# Patient Record
Sex: Female | Born: 1995 | Race: Black or African American | Hispanic: No | Marital: Single | Smoking: Never smoker
Health system: Southern US, Community
[De-identification: ages and names within clinical notes are randomized; demographics above are authoritative.]

## PROBLEM LIST (undated history)

## (undated) DIAGNOSIS — T7840XA Allergy, unspecified, initial encounter: Secondary | ICD-10-CM

## (undated) DIAGNOSIS — J45909 Unspecified asthma, uncomplicated: Secondary | ICD-10-CM

## (undated) DIAGNOSIS — K219 Gastro-esophageal reflux disease without esophagitis: Secondary | ICD-10-CM

## (undated) HISTORY — PX: CHOLECYSTECTOMY: SHX55

## (undated) HISTORY — DX: Allergy, unspecified, initial encounter: T78.40XA

---
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
## 2017-04-28 ENCOUNTER — Emergency Department (HOSPITAL_COMMUNITY): Payer: Medicaid Other

## 2017-04-28 ENCOUNTER — Encounter (HOSPITAL_COMMUNITY): Payer: Self-pay | Admitting: Emergency Medicine

## 2017-04-28 ENCOUNTER — Emergency Department (HOSPITAL_COMMUNITY)
Admission: EM | Admit: 2017-04-28 | Discharge: 2017-04-28 | Disposition: A | Discharge status: Home / Self Care | Payer: Medicaid Other | Attending: Emergency Medicine | Admitting: Emergency Medicine

## 2017-04-28 DIAGNOSIS — Z79899 Other long term (current) drug therapy: Secondary | ICD-10-CM | POA: Diagnosis not present

## 2017-04-28 DIAGNOSIS — R51 Headache: Secondary | ICD-10-CM | POA: Diagnosis not present

## 2017-04-28 DIAGNOSIS — J45909 Unspecified asthma, uncomplicated: Secondary | ICD-10-CM | POA: Insufficient documentation

## 2017-04-28 DIAGNOSIS — I158 Other secondary hypertension: Secondary | ICD-10-CM | POA: Diagnosis not present

## 2017-04-28 DIAGNOSIS — R519 Headache, unspecified: Secondary | ICD-10-CM

## 2017-04-28 LAB — I-STAT CHEM 8, ED
BUN: 5 mg/dL — ABNORMAL LOW (ref 6–20)
Calcium, Ion: 1.26 mmol/L (ref 1.15–1.40)
Chloride: 101 mmol/L (ref 101–111)
Creatinine, Ser: 0.7 mg/dL (ref 0.44–1.00)
Glucose, Bld: 107 mg/dL — ABNORMAL HIGH (ref 65–99)
HCT: 41 % (ref 36.0–46.0)
Hemoglobin: 13.9 g/dL (ref 12.0–15.0)
Potassium: 3.8 mmol/L (ref 3.5–5.1)
Sodium: 141 mmol/L (ref 135–145)
TCO2: 29 mmol/L (ref 0–100)

## 2017-04-28 LAB — I-STAT BETA HCG BLOOD, ED (MC, WL, AP ONLY)

## 2017-04-28 MED ORDER — KETOROLAC TROMETHAMINE 30 MG/ML IJ SOLN
30.0000 mg | Freq: Once | INTRAMUSCULAR | Status: AC
  Administered 2017-04-28: 30 mg via INTRAVENOUS
  Filled 2017-04-28: qty 1

## 2017-04-28 MED ORDER — DIPHENHYDRAMINE HCL 50 MG/ML IJ SOLN
25.0000 mg | Freq: Once | INTRAMUSCULAR | Status: AC
  Administered 2017-04-28: 25 mg via INTRAVENOUS
  Filled 2017-04-28: qty 1

## 2017-04-28 MED ORDER — SODIUM CHLORIDE 0.9 % IV BOLUS (SEPSIS)
1000.0000 mL | Freq: Once | INTRAVENOUS | Status: AC
  Administered 2017-04-28: 1000 mL via INTRAVENOUS

## 2017-04-28 MED ORDER — PROCHLORPERAZINE EDISYLATE 5 MG/ML IJ SOLN
10.0000 mg | Freq: Once | INTRAMUSCULAR | Status: AC
  Administered 2017-04-28: 10 mg via INTRAVENOUS
  Filled 2017-04-28: qty 2

## 2017-04-28 MED ORDER — DEXAMETHASONE SODIUM PHOSPHATE 10 MG/ML IJ SOLN
10.0000 mg | Freq: Once | INTRAMUSCULAR | Status: AC
  Administered 2017-04-28: 10 mg via INTRAVENOUS
  Filled 2017-04-28: qty 1

## 2017-04-28 MED ORDER — BUTALBITAL-APAP-CAFFEINE 50-325-40 MG PO TABS: 1.0000 | ORAL_TABLET | Freq: Four times a day (QID) | ORAL | 0 refills | Status: AC | PRN

## 2017-04-28 NOTE — ED Triage Notes (Signed)
Pt reports a generalized HA for the past week that is not going away. Tried ibuprofen and tylenol without relief. No emesis. No difference in pain with lights or loud sounds.

## 2017-04-28 NOTE — Discharge Instructions (Signed)
Return here as needed. Follow up with a primary doctor.  Your head CT was normal. Definitely need to follow-up with the primary doctor about your blood pressure

## 2017-04-29 NOTE — ED Provider Notes (Signed)
[facility] Provider Note   CSN: [id number] Arrival date & time: 04/28/17  1802     History   Chief Complaint Chief Complaint  Patient presents with  . Headache    HPI Pamela Valencia is a 21 y.o. female.  HPI Patient presents to the emergency department with generalized headache over the last 5 days.  The patient states that the headache started Tuesday and she went to an urgent care and was given Toradol with minimal relief of her symptoms.  She states her in this timeframe she has noticed that her blood pressure has been elevated, states that she has not noted her blood pressure to be elevated in the past. The patient denies chest pain, shortness of breath,blurred vision, neck pain, fever, cough, weakness, numbness,anorexia, edema, abdominal pain, nausea, vomiting, diarrhea, rash, back pain, dysuria, hematemesis, bloody stool, near syncope, or syncope. Past Medical History:  Diagnosis Date  . Acid reflux   . Asthma     There are no active problems to display for this patient.   History reviewed. No pertinent surgical history.  OB History    No data available       Home Medications    Prior to Admission medications   Medication Sig Start Date End Date Taking? Authorizing Provider  azithromycin (ZITHROMAX) 250 MG tablet Take 1 tablet (250 mg total) by mouth daily. Take first 2 tablets together, then 1 every day until finished. Patient not taking: Reported on 07/23/2016 07/15/16   Riki SheerYoung, Michelle G, PA-C  butalbital-acetaminophen-caffeine [place](FIORICET, ESGIC) [phone] MG tablet Take 1-2 tablets by mouth every 6 (six) hours as needed for headache. 04/28/17 04/28/18  Neyah Ellerman, Cristal Deerhristopher, PA-C  famotidine (PEPCID) 20 MG tablet Take 1 tablet (20 mg total) by mouth 2 (two) times daily. 07/23/16   Kirichenko, Lemont Fillersatyana, PA-C  hydrOXYzine (ATARAX/VISTARIL) 10 MG tablet Take 10 mg by mouth daily.    [provider]  ibuprofen (ADVIL,MOTRIN) 800 MG tablet Take 800  mg by mouth every 8 (eight) hours as needed for pain.    [provider]  Levonorgestrel-Ethinyl Estradiol (AMETHIA,CAMRESE) 0.15-0.03 &0.01 MG tablet Take 1 tablet by mouth daily. 09/02/15   Allie Bossierove, Myra C, MD  montelukast (SINGULAIR) 10 MG tablet Take 10 mg by mouth at bedtime.    [provider]  nystatin (MYCOSTATIN) 100000 UNIT/ML suspension Take 5 mLs (500,000 Units total) by mouth 4 (four) times daily. Patient not taking: Reported on 07/23/2016 07/15/16   Riki SheerYoung, Michelle G, PA-C  omeprazole (PRILOSEC) 20 MG capsule  07/10/16   [provider]  ondansetron (ZOFRAN ODT) 8 MG disintegrating tablet Take 1 tablet (8 mg total) by mouth every 8 (eight) hours as needed for nausea or vomiting. 07/23/16   Kirichenko, Ceriah, PA-C  sucralfate (CARAFATE) 1 g tablet Take 1 tablet (1 g total) by mouth 4 (four) times daily -  with meals and at bedtime. 07/23/16   Jaynie CrumbleKirichenko, Brunella, PA-C    Family History History reviewed. No pertinent family history.  Social History Social History  Substance Use Topics  . Smoking status: Never Smoker  . Smokeless tobacco: Never Used  . Alcohol use No     Allergies   Penicillins   Review of Systems Review of Systems  All other systems negative except as documented in the HPI. All pertinent positives and negatives as reviewed in the HPI. Physical Exam Updated Vital Signs BP (!) 138/93 (BP Location: Left Arm)   Pulse 80   Temp 98.1 F (36.7 C) (Oral)  Resp 16   LMP 04/22/2017   SpO2 100%   Physical Exam  Constitutional: She is oriented to person, place, and time. She appears well-developed and well-nourished. No distress.  HENT:  Head: Normocephalic and atraumatic.  Mouth/Throat: Oropharynx is clear and moist.  Eyes: Pupils are equal, round, and reactive to light.  Neck: Normal range of motion. Neck supple.  Cardiovascular: Normal rate, regular rhythm and normal heart sounds.  Exam reveals no gallop and no friction rub.    No murmur heard. Pulmonary/Chest: Effort normal and breath sounds normal. No respiratory distress. She has no wheezes.  Neurological: She is alert and oriented to person, place, and time. She displays normal reflexes. No cranial nerve deficit or sensory deficit. She exhibits normal muscle tone. Coordination normal.  Skin: Skin is warm and dry. Capillary refill takes less than 2 seconds. No rash noted. No erythema.  Psychiatric: She has a normal mood and affect. Her behavior is normal.  Nursing note and vitals reviewed.    ED Treatments / Results  Labs (all labs ordered are listed, but only abnormal results are displayed) Labs Reviewed  I-STAT CHEM 8, ED - Abnormal; Notable for the following:       Result Value   BUN 5 (*)    Glucose, Bld 107 (*)    All other components within normal limits  I-STAT BETA HCG BLOOD, ED ([facility], [facility], [facility] ONLY)    EKG  EKG Interpretation None       Radiology Ct Head Wo Contrast  Result Date: 04/28/2017 CLINICAL DATA:  Generalized headache for 1 week. No relief with home administration of over the counter meds. EXAM: CT HEAD WITHOUT CONTRAST TECHNIQUE: Contiguous axial images were obtained from the base of the skull through the vertex without intravenous contrast. COMPARISON:  None. FINDINGS: Brain: There is no intracranial hemorrhage, mass or evidence of acute infarction. There is no extra-axial fluid collection. Gray matter and white matter appear normal. Cerebral volume is normal for age. Brainstem and posterior fossa are unremarkable. The CSF spaces appear normal. Vascular: No hyperdense vessel or unexpected calcification. Skull: Normal. Negative for fracture or focal lesion. Sinuses/Orbits: No acute finding. Other: None. IMPRESSION: Normal brain Electronically Signed   By: Ellery Plunkaniel R Mitchell M.D.   On: 04/28/2017 21:32    Procedures Procedures (including critical care time)  Medications Ordered in ED Medications  sodium chloride 0.9 % bolus 1,000 mL  (0 mLs Intravenous Stopped 04/28/17 2234)  prochlorperazine (COMPAZINE) injection 10 mg (10 mg Intravenous Given 04/28/17 2052)  dexamethasone (DECADRON) injection 10 mg (10 mg Intravenous Given 04/28/17 2053)  diphenhydrAMINE (BENADRYL) injection 25 mg (25 mg Intravenous Given 04/28/17 2053)  ketorolac (TORADOL) 30 MG/ML injection 30 mg (30 mg Intravenous Given 04/28/17 2053)     Initial Impression / Assessment and Plan / ED Course  I have reviewed the triage vital signs and the nursing notes.  Pertinent labs & imaging results that were available during my care of the patient were reviewed by me and considered in my medical decision making (see chart for details).  Clinical Course as of Apr 30 151  Fri Apr 28, 2017  2021 Comment 3:        [AK]  2021 Comment 3:        [AK]  2021 I-stat hCG, quantitative: <5.0 [AK]  2022 I-stat hCG, quantitative: <5.0 [AK]  2022 Comment 3:        [AK]  2201 CT Head Wo Contrast [AK]    Clinical Course  User Index [AK] Jana Half, Student-PA    Patient does have elevated blood pressure, but I did advise her that we would need to get her headache under control first and then evaluate the blood pressure.  There is laceration to follow-up with the primary care Dr. for evaluation of her blood pressure as this could be causing symptoms for her.  Patient agrees the plan and all questions were any signs of hypertensive urgency or crisis at this point  Final Clinical Impressions(s) / ED Diagnoses   Final diagnoses:  Bad headache  Other secondary hypertension    New Prescriptions Discharge Medication List as of 04/28/2017 10:24 PM    START taking these medications   Details  butalbital-acetaminophen-caffeine (FIORICET, ESGIC) 50-325-40 MG tablet Take 1-2 tablets by mouth every 6 (six) hours as needed for headache., Starting Fri 04/28/2017, Until Sat 04/28/2018, Print         Daden Mahany, [place], PA-C 04/29/17 0154    Tilden Fossa, MD 04/30/17  [phone]

## 2017-09-14 ENCOUNTER — Ambulatory Visit: Payer: Self-pay | Admitting: Family Medicine

## 2017-09-18 ENCOUNTER — Ambulatory Visit: Payer: Self-pay | Admitting: Family Medicine

## 2018-03-07 ENCOUNTER — Encounter: Payer: Self-pay | Admitting: *Deleted

## 2018-03-22 ENCOUNTER — Encounter: Payer: Medicaid Other | Admitting: Obstetrics & Gynecology

## 2018-04-17 ENCOUNTER — Emergency Department (HOSPITAL_COMMUNITY)
Admission: EM | Admit: 2018-04-17 | Discharge: 2018-04-18 | Discharge status: Against Medical Advice | Payer: Medicaid Other | Attending: Emergency Medicine | Admitting: Emergency Medicine

## 2018-04-17 ENCOUNTER — Encounter (HOSPITAL_COMMUNITY): Payer: Self-pay | Admitting: Emergency Medicine

## 2018-04-17 DIAGNOSIS — R1084 Generalized abdominal pain: Secondary | ICD-10-CM | POA: Diagnosis not present

## 2018-04-17 DIAGNOSIS — R300 Dysuria: Secondary | ICD-10-CM | POA: Insufficient documentation

## 2018-04-17 DIAGNOSIS — R110 Nausea: Secondary | ICD-10-CM | POA: Diagnosis not present

## 2018-04-17 LAB — URINALYSIS, ROUTINE W REFLEX MICROSCOPIC
Bilirubin Urine: NEGATIVE
GLUCOSE, UA: NEGATIVE mg/dL
Hgb urine dipstick: NEGATIVE
KETONES UR: NEGATIVE mg/dL
LEUKOCYTES UA: NEGATIVE
Nitrite: NEGATIVE
PROTEIN: NEGATIVE mg/dL
Specific Gravity, Urine: 1.005 (ref 1.005–1.030)
pH: 5 (ref 5.0–8.0)

## 2018-04-17 LAB — CBC
HCT: 38.5 % (ref 36.0–46.0)
Hemoglobin: 12.4 g/dL (ref 12.0–15.0)
MCH: 29.7 pg (ref 26.0–34.0)
MCHC: 32.2 g/dL (ref 30.0–36.0)
MCV: 92.3 fL (ref 78.0–100.0)
PLATELETS: 313 10*3/uL (ref 150–400)
RBC: 4.17 MIL/uL (ref 3.87–5.11)
RDW: 12.1 % (ref 11.5–15.5)
WBC: 14.9 10*3/uL — AB (ref 4.0–10.5)

## 2018-04-17 LAB — I-STAT TROPONIN, ED: TROPONIN I, POC: 0 ng/mL (ref 0.00–0.08)

## 2018-04-17 NOTE — ED Notes (Signed)
Pt informed RN that she was leaving, will follow up w/ PCP tomorrow.  RN attempted several times to convince pt to stay.

## 2018-04-17 NOTE — ED Triage Notes (Signed)
Patient reports upper/mid abdominal pain with nausea onset this evening , denies emesis or diarrhea , pt. added mild dysuria , no fever or chills .

## 2018-04-18 ENCOUNTER — Ambulatory Visit: Payer: Medicaid Other | Admitting: Physical Therapy

## 2018-04-18 LAB — COMPREHENSIVE METABOLIC PANEL
ALT: 19 U/L (ref 0–44)
ANION GAP: 10 (ref 5–15)
AST: 17 U/L (ref 15–41)
Albumin: 4.1 g/dL (ref 3.5–5.0)
Alkaline Phosphatase: 86 U/L (ref 38–126)
BUN: 10 mg/dL (ref 6–20)
CALCIUM: 9.9 mg/dL (ref 8.9–10.3)
CO2: 23 mmol/L (ref 22–32)
CREATININE: 1.02 mg/dL — AB (ref 0.44–1.00)
Chloride: 106 mmol/L (ref 98–111)
GFR calc Af Amer: >60 mL/min (ref >60)
GFR calc non Af Amer: >60 mL/min (ref >60)
Glucose, Bld: 120 mg/dL — ABNORMAL HIGH (ref 70–99)
Potassium: 4 mmol/L (ref 3.5–5.1)
SODIUM: 139 mmol/L (ref 135–145)
Total Bilirubin: 0.4 mg/dL (ref 0.3–1.2)
Total Protein: 8 g/dL (ref 6.5–8.1)

## 2018-04-18 LAB — LIPASE, BLOOD: LIPASE: 31 U/L (ref 11–51)

## 2018-04-24 ENCOUNTER — Other Ambulatory Visit: Payer: Self-pay

## 2018-04-24 ENCOUNTER — Ambulatory Visit: Payer: Medicaid Other | Attending: Physician Assistant | Admitting: Physical Therapy

## 2018-04-24 ENCOUNTER — Encounter: Payer: Self-pay | Admitting: Physical Therapy

## 2018-04-24 DIAGNOSIS — M5441 Lumbago with sciatica, right side: Secondary | ICD-10-CM | POA: Insufficient documentation

## 2018-04-24 DIAGNOSIS — M62830 Muscle spasm of back: Secondary | ICD-10-CM | POA: Diagnosis present

## 2018-04-24 DIAGNOSIS — G8929 Other chronic pain: Secondary | ICD-10-CM | POA: Insufficient documentation

## 2018-04-24 NOTE — Therapy (Signed)
[facility] [address] [place], [place], [address] Phone: [phone]   Fax:  [phone]  Physical Therapy Evaluation  Patient Details  Name: Pamela Valencia MRN: [id number] Date of Birth: 22-Aug-1996 Referring Provider: Neldon Labella PA    Encounter Date: 04/24/2018  PT End of Session - 04/24/18 1055    Visit Number  1    Number of Visits  12    Date for PT Re-Evaluation  06/05/18    Authorization Type  Mediciad     PT Start Time  0930    PT Stop Time  1012    PT Time Calculation (min)  42 min    Activity Tolerance  Patient tolerated treatment well    Behavior During Therapy  [facility] for tasks assessed/performed       Past Medical History:  Diagnosis Date  . Acid reflux   . Asthma     History reviewed. No pertinent surgical history.  There were no vitals filed for this visit.   Subjective Assessment - 04/24/18 0936    Subjective  Patient has been having lower back pain for 3-4 months as well as tigling into her right arm and her right leg. The pain is worse when she is up and moving. Thge tingling usualy starts in her hand then travels down into her arm.    Limitations  Standing;Walking;Lifting    How long can you stand comfortably?  variable     Diagnostic tests  MRI: per patient negative X-ray: potential scoliosis     Patient Stated Goals  Less pain in the back; get rid of tingling     Currently in Pain?  Yes    Pain Descriptors / Indicators  Aching;Sharp;Tingling    Pain Type  Acute pain    Pain Onset  More than a month ago    Pain Frequency  Constant         [facility] PT Assessment - 04/24/18 0001      Assessment   Medical Diagnosis  Low Back pain with right radiculopathy     Referring Provider  Misty Stanley Wingate PA     Onset Date/Surgical Date  -- 4 months ago     Hand Dominance  Right    Prior Therapy  None       Precautions   Precautions  None      Restrictions   Weight Bearing Restrictions  No      Balance Screen   Has the patient fallen in the past 6 months  No    Has the patient had a decrease in activity level because of a fear of falling?   No    Is the patient reluctant to leave their home because of a fear of falling?   No      Home Environment   Additional Comments  Steps into the house but does not cause tringling       Prior Function   Level of Independence  Independent    Vocation  Full time employment    Horticulturist, commercial at [employer]     Leisure  Nothing       Cognition   Overall Cognitive Status  Within Functional Limits for tasks assessed    Attention  Focused    Focused Attention  Appears intact    Memory  Appears intact    Awareness  Appears intact    Problem Solving  Appears intact      Observation/Other Assessments  Focus on Therapeutic Outcomes (FOTO)   Medicaid       Sensation   Additional Comments  tingling into right lower and upper extremity       Coordination   Gross Motor Movements are Fluid and Coordinated  Yes    Fine Motor Movements are Fluid and Coordinated  Yes      ROM / Strength   AROM / PROM / Strength  AROM;PROM;Strength      AROM   AROM Assessment Site  Lumbar    Lumbar Flexion  limited 25% with ain at end range     Lumbar Extension  pain at end range but no ;limit     Lumbar - Right Side Bend  Pain to the right    Lumbar - Left Side Bend  stretch to the left     Lumbar - Right Rotation  pain     Lumbar - Left Rotation  normal       PROM   PROM Assessment Site  Hip    Right/Left Hip  Right;Left    Right Hip Internal Rotation   -- Pain at end range       Strength   Strength Assessment Site  Hip;Knee;Ankle    Right/Left Hip  Right;Left    Right Hip Flexion  4/5    Right Hip Extension  4/5    Right Hip ABduction  4/5    Left Hip Flexion  4+/5    Left Hip ABduction  5/5    Left Hip ADduction  5/5      Right Hip   Right Hip Flexion  -- pain at end range       Palpation   Spinal mobility  decreased PA  glide at L3 and L4;     SI assessment   ASIS equal       Special Tests   Other special tests  SI compression (-)       Ambulation/Gait   Gait Comments  decreased hip flexion on the right. Scuffed her foot when walking                 Objective measurements completed on examination: See above findings.      [facility] Adult PT Treatment/Exercise - 04/24/18 0001      Lumbar Exercises: Stretches   Other Lumbar Stretch Exercise  prayer stretch 2x20 sec and lateral 1x320 sec also reviewed how to do it at the sink;       Lumbar Exercises: Standing   Other Standing Lumbar Exercises  tennis ball trigger point release       Lumbar Exercises: Supine   AB Set Limitations  x5 with cuing     Pelvic Tilt Limitations  x5 with cuing with ab breathing              PT Education - 04/24/18 2021    Education Details  symptom mangement     Person(s) Educated  Patient    Methods  Explanation;Demonstration;Tactile cues;Verbal cues    Comprehension  Verbalized understanding;Returned demonstration;Verbal cues required;Tactile cues required;Need further instruction       PT Short Term Goals - 04/24/18 1048      PT SHORT TERM GOAL #1   Title  Patient will increase right lower extremity strength to 5/5     Baseline  4/5 right hip flexion 4+/5 right hip abduction     Time  4    Period  Weeks    Status  New  Target Date  05/22/18      PT SHORT TERM GOAL #2   Title  Patient will demostrate full lumbar flexion without pain     Baseline  limited 25% but significant pain     Time  4    Period  Weeks    Status  New    Target Date  05/22/18      PT SHORT TERM GOAL #3   Title  Patient will be independent with initial HEP     Baseline  No HEP     Time  4    Period  Weeks    Status  New    Target Date  05/22/18        PT Long Term Goals - 04/24/18 1051      PT LONG TERM GOAL #1   Title  Patient will bend over to pivk object off the ground without pain     Time  6    Period   Weeks    Status  New    Target Date  06/05/18      PT LONG TERM GOAL #2   Title  Patient will report no numbness or lower backp pain when standing >30 min in order to work    Baseline  at times can only stand 5-10 minutes beofree numbness and tingling increase.     Time  6    Period  Weeks    Status  New    Target Date  06/05/18             Plan - 04/24/18 2022    Clinical Impression Statement  Patient is a 22 year old female with right sided lower back pain that radiates into her right leg. She also has numbness and tingling into her right leg. She also reports numbness and tingling in the right upper extremity. She has weakness in her right hand compared to her left. She also has right hip weakness. Per patient report her x-ray shows scoliosis. Signs and symptoms are consitent with lumbar facet joint dysfucntion. She has significant spasming in her right lumbar paraspinales into her right gluteal. She would benefit from skilled therapy for a stretching and core strengthening program as well as manual therapy and dry needling to reduce spasming of the lumbar spine.    History and Personal Factors relevant to plan of care:  None     Clinical Presentation  Evolving    Clinical Presentation due to:  increasing spasming and numbness into her right lower extremity     Clinical Decision Making  Low    Rehab Potential  Good    PT Frequency  2x / week    PT Duration  6 weeks    PT Treatment/Interventions  ADLs/Self Care Home Management;Cryotherapy;Electrical Stimulation;Iontophoresis 4mg /ml Dexamethasone;Moist Heat;Ultrasound;Gait training;Stair training;Therapeutic activities;Therapeutic exercise;Patient/family education;Neuromuscular re-education;Manual techniques;Passive range of motion;Dry needling;Taping    PT Next Visit Plan  TPDN when able; begin core strengthening; bridges; clam shell; consdier quadruped if no tingling; progress to palnks; consdier UE core stability exercises; manual  therapy to right lumbar spine LAD; review home exercises     PT Home Exercise Plan  prayer stretch; lateral prayer stretch; piriformis stretch, tennis ball trigger point release; posterior pelvic tilt with abdominal breathing     Consulted and Agree with Plan of Care  Patient       Patient will benefit from skilled therapeutic intervention in order to improve the following deficits and impairments:  Pain, Postural  dysfunction, Decreased activity tolerance, Decreased endurance, Decreased range of motion, Decreased strength, Increased muscle spasms  Visit Diagnosis: Chronic right-sided low back pain with right-sided sciatica - Plan: PT plan of care cert/re-cert  Muscle spasm of back - Plan: PT plan of care cert/re-cert     Problem List There are no active problems to display for this patient.   Dessie Coma PT DPT  04/24/2018, 8:42 PM  [facility] [address] [place], [place], [address] Phone: [phone]   Fax:  [phone]  Name: SAYWARD HORVATH MRN: [id number] Date of Birth: 13-May-1996

## 2018-04-24 NOTE — Patient Instructions (Signed)
Unable to put in 2nd to update

## 2018-04-27 NOTE — Addendum Note (Signed)
Addended by: Dessie ComaARROLL, Nicol Herbig J on: 04/27/2018 11:04 AM   Modules accepted: Orders

## 2018-05-01 ENCOUNTER — Ambulatory Visit: Payer: Medicaid Other | Admitting: Physical Therapy

## 2018-05-07 ENCOUNTER — Ambulatory Visit: Payer: Medicaid Other | Admitting: Physical Therapy

## 2018-05-16 ENCOUNTER — Ambulatory Visit: Payer: Medicaid Other | Attending: Physician Assistant | Admitting: Physical Therapy

## 2018-05-16 ENCOUNTER — Encounter: Payer: Self-pay | Admitting: Physical Therapy

## 2018-05-16 DIAGNOSIS — G8929 Other chronic pain: Secondary | ICD-10-CM | POA: Diagnosis present

## 2018-05-16 DIAGNOSIS — M5441 Lumbago with sciatica, right side: Secondary | ICD-10-CM | POA: Diagnosis not present

## 2018-05-16 DIAGNOSIS — M62830 Muscle spasm of back: Secondary | ICD-10-CM | POA: Insufficient documentation

## 2018-05-16 NOTE — Therapy (Signed)
[place], [place], [address] Phone: [phone]   Fax:  [phone]  Physical Therapy Treatment/ Discharge   Patient Details  Name: Pamela Valencia MRN: [id number] Date of Birth: 1996/08/30 Referring Provider: Precious Gilding PA    Encounter Date: 05/16/2018  PT End of Session - 05/16/18 1033    Visit Number  2    Number of Visits  12    Date for PT Re-Evaluation  06/05/18    Authorization Type  [employer]     PT Start Time  1015    PT Stop Time  1056    PT Time Calculation (min)  41 min    Activity Tolerance  Patient tolerated treatment well    Behavior During Therapy  [facility] for tasks assessed/performed       Past Medical History:  Diagnosis Date  . Acid reflux   . Asthma     History reviewed. No pertinent surgical history.  There were no vitals filed for this visit.  Subjective Assessment - 05/16/18 1033    Subjective  Patient feels like some of the stretches help but overall it is the same. The patient hasnt been able to come to therapy for 3 weeks now because of work. She loives in [place] and only comes to [place] for therapy. She had an instance last week where her leg went numb.     Limitations  Standing;Walking;Lifting    How long can you stand comfortably?  variable     Diagnostic tests  MRI: per patient negative X-ray: potential scoliosis     Patient Stated Goals  Less pain in the back; get rid of tingling     Currently in Pain?  Yes    Pain Score  5     Pain Location  Back    Pain Orientation  Right;Lower    Pain Descriptors / Indicators  Aching    Pain Type  Acute pain    Pain Onset  More than a month ago    Pain Frequency  Constant    Aggravating Factors   standing for too long     Pain Relieving Factors  rest     Multiple Pain Sites  No                       [facility] Adult PT Treatment/Exercise - 05/16/18 0001      Lumbar Exercises: Stretches   Other  Lumbar Stretch Exercise  lower trunk rotation 2x10       Lumbar Exercises: Standing   Other Standing Lumbar Exercises  shoulder extension red x10 scap retraction x10       Lumbar Exercises: Supine   AB Set Limitations  x5 with cuing     Clam Limitations  2x10 green     Bridge Limitations  2x10     Straight Leg Raises Limitations  2x10       Manual Therapy   Manual therapy comments  soft tissue mobilization to right upper gluteal and right lumbar spine              PT Education - 05/16/18 1037    Education Details  symptom mangement     Person(s) Educated  Patient    Methods  Explanation;Demonstration;Tactile cues;Verbal cues    Comprehension  Verbalized understanding;Verbal cues required;Tactile cues required;Returned demonstration;Need further instruction       PT Short Term Goals - 05/16/18 1704  PT SHORT TERM GOAL #1   Title  Patient will increase right lower extremity strength to 5/5     Baseline  4/5 right hip flexion 4+/5 right hip abduction     Time  4    Period  Weeks    Status  On-going      PT SHORT TERM GOAL #2   Title  Patient will demostrate full lumbar flexion without pain     Baseline  limited 25% but significant pain     Time  4    Period  Weeks    Status  On-going      PT SHORT TERM GOAL #3   Title  Patient will be independent with initial HEP     Baseline  No HEP     Time  4    Period  Weeks    Status  On-going        PT Long Term Goals - 04/24/18 1051      PT LONG TERM GOAL #1   Title  Patient will bend over to pivk object off the ground without pain     Time  6    Period  Weeks    Status  New    Target Date  06/05/18      PT LONG TERM GOAL #2   Title  Patient will report no numbness or lower backp pain when standing >30 min in order to work    Baseline  at times can only stand 5-10 minutes beofree numbness and tingling increase.     Time  6    Period  Weeks    Status  New    Target Date  06/05/18            Plan -  05/16/18 1041    Clinical Impression Statement  Patient has only been able to come to one visit 2nd to living in [place], working, and she will be starting school. She was advised that there are clinics closer to where she lives. Her symptoms remain reactive. She had increased numbness in her arm when lying supine. She has spasming in her glutes and right parapsinals. She would benefit from further therapy but ids having difficulty getting here. Therapy will discharge her at this time. Therapy reviewed a light core strengthening program with the patient. she was advised to do 2-3 stretche sand 2-3 exercises daily.     Clinical Presentation  Evolving    Clinical Decision Making  Low    PT Frequency  2x / week    PT Duration  6 weeks    PT Treatment/Interventions  ADLs/Self Care Home Management;Cryotherapy;Electrical Stimulation;Iontophoresis 2m/ml Dexamethasone;Moist Heat;Ultrasound;Gait training;Stair training;Therapeutic activities;Therapeutic exercise;Patient/family education;Neuromuscular re-education;Manual techniques;Passive range of motion;Dry needling;Taping    PT Next Visit Plan  d[facility] [facility]; lateral prayer stretch; piriformis stretch, tennis ball trigger point release; posterior pelvic tilt with abdominal breathing     Consulted and Agree with Plan of Care  Patient       Patient will benefit from skilled therapeutic intervention in order to improve the following deficits and impairments:  Pain, Postural dysfunction, Decreased activity tolerance, Decreased endurance, Decreased range of motion, Decreased strength, Increased muscle spasms  Visit Diagnosis: Chronic right-sided low back pain with right-sided sciatica  Muscle spasm of back  PHYSICAL THERAPY DISCHARGE SUMMARY  Visits from Start of Care: *2  Current functional level related to goals / functional outcomes: Has an HEP but no  major difference after only 2 visits    Remaining deficits: Unable to attend 2nd to living and working in a different city    Education / Equipment: HEP  Plan: Patient agrees to discharge.  Patient goals were met. Patient is being discharged due to financial reasons.  ?????       Problem List There are no active problems to display for this patient.   Carney Living PT DPT  05/16/2018, 5:08 PM  [facility] [address] [place], [place], [address] Phone: [phone]   Fax:  [phone]  Name: Pamela Valencia MRN: [id number] Date of Birth: 14-Nov-1995

## 2018-12-24 IMAGING — CT CT HEAD W/O CM
3 of 4 series · 16 of 47 positions shown, 19 images · non-contrast
Comparison: None.

CLINICAL DATA: Generalized headache for 1 week. No relief with home
administration of over the counter meds.

EXAM:
CT HEAD WITHOUT CONTRAST
TECHNIQUE: Contiguous axial images were obtained from the base of the skull
through the vertex without intravenous contrast.

[Series 2: head w/o · axial · non-contrast · 0.45mm/px · z∈[+1713,+1833]mm · 10 of 29 slices shown, 13 images]
[im 3/29  brain]
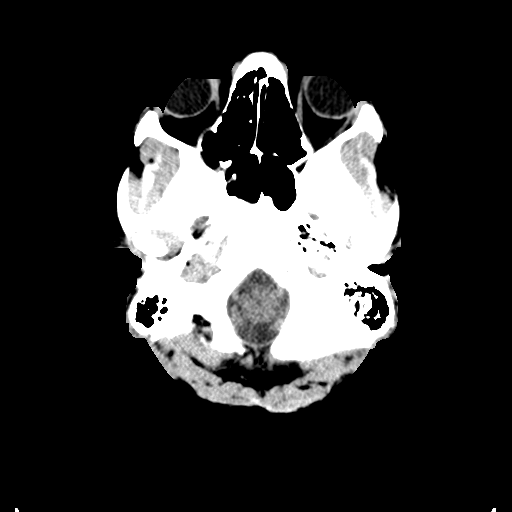
[im 3/29  bone]
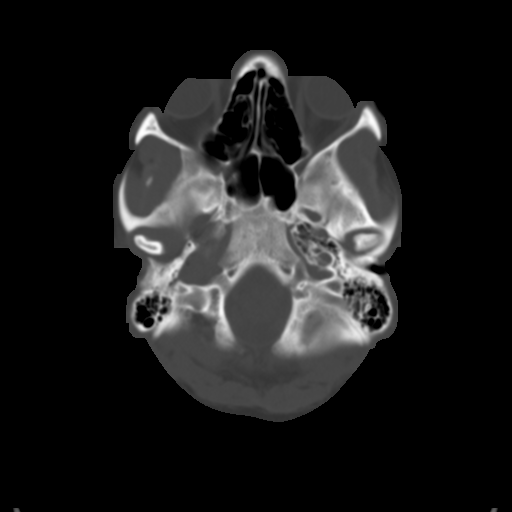
[im 5/29  brain]
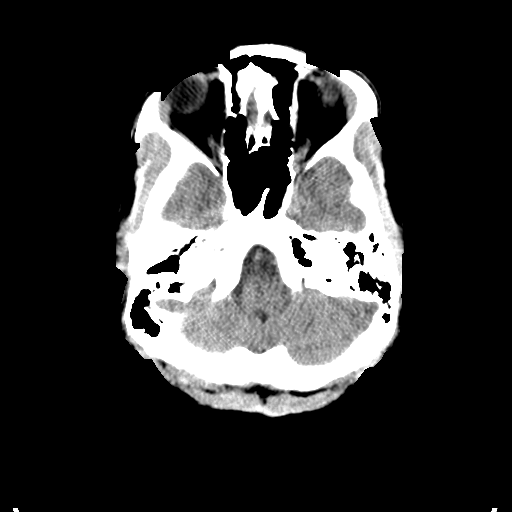
[im 9/29  brain]
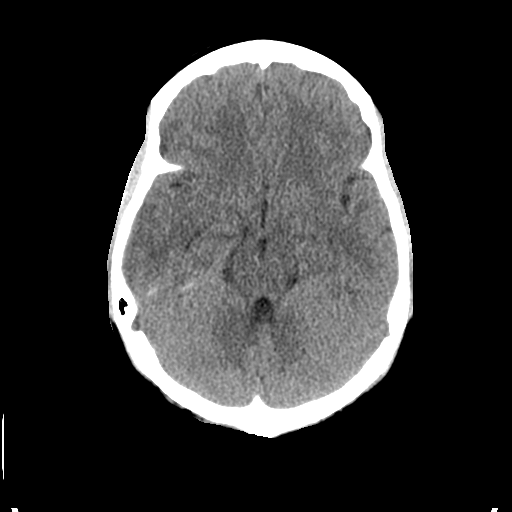
[im 11/29  brain]
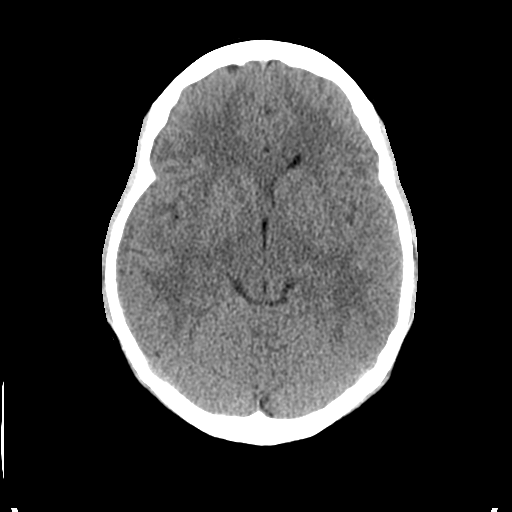
[im 13/29  brain]
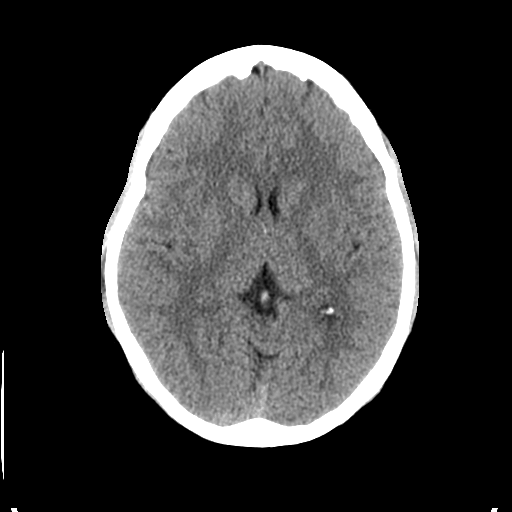
[im 13/29  bone]
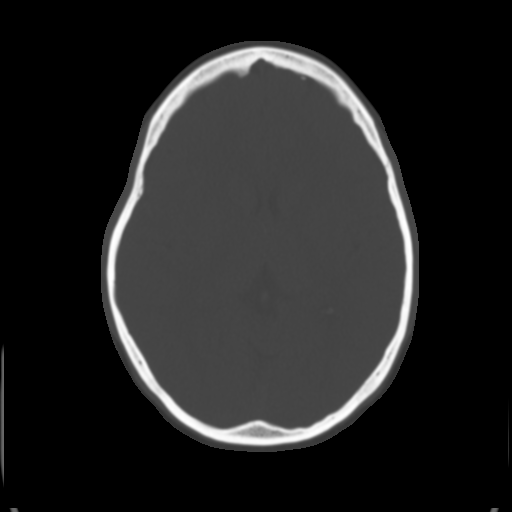
[im 17/29  brain]
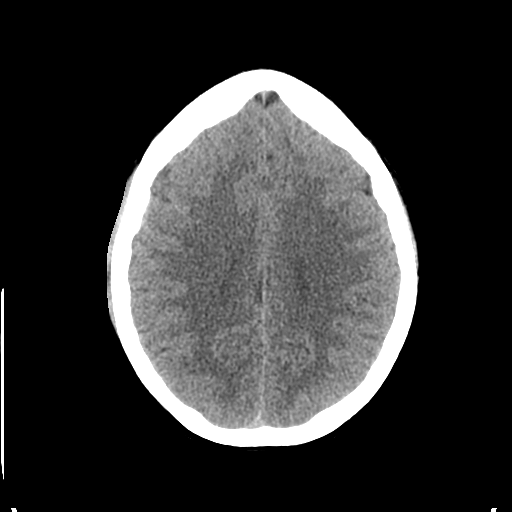
[im 19/29  brain]
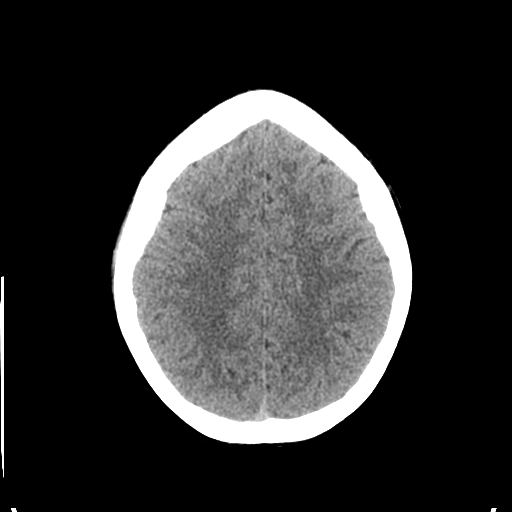
[im 21/29  brain]
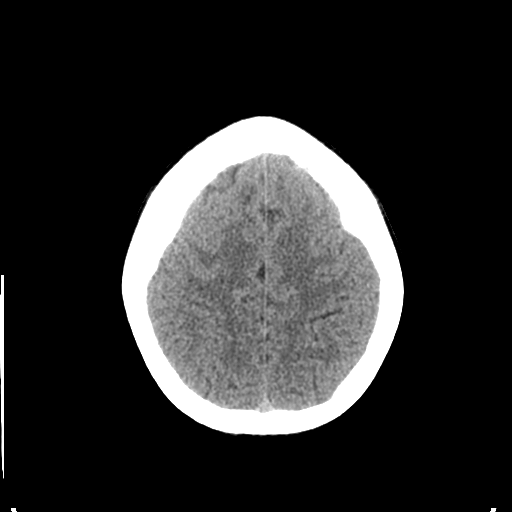
[im 25/29  brain]
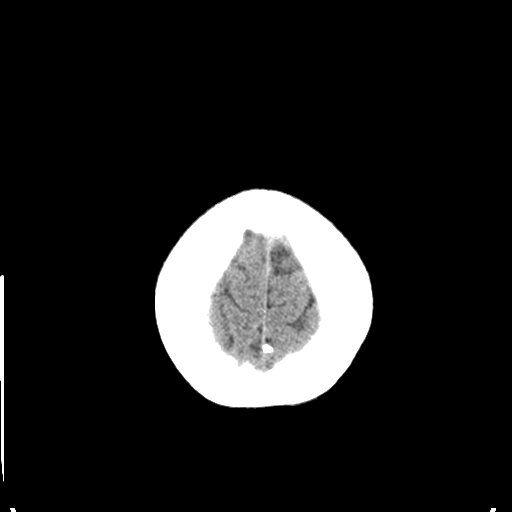
[im 25/29  bone]
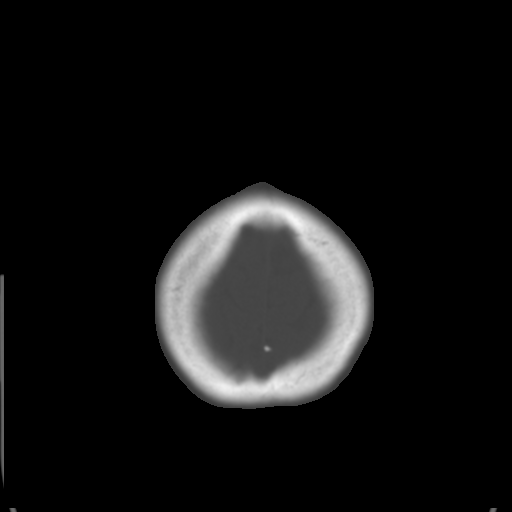
[im 27/29  brain]
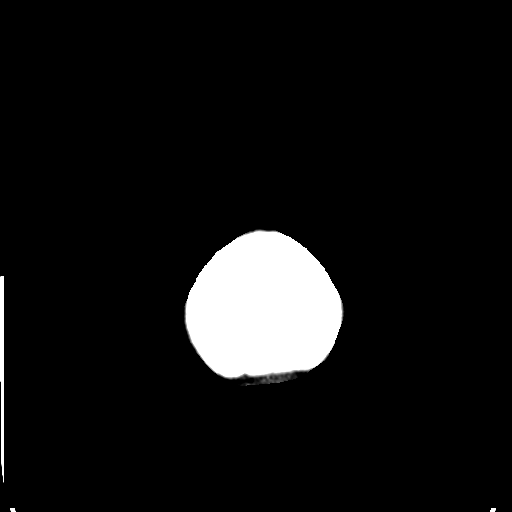

[Series 4: coronal · coronal · 0.29mm/px · 3 of 63 slices shown]
[im 21/63  brain]
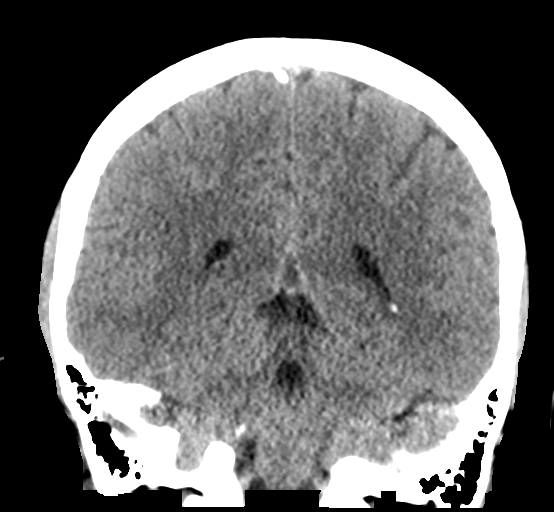
[im 28/63  brain]
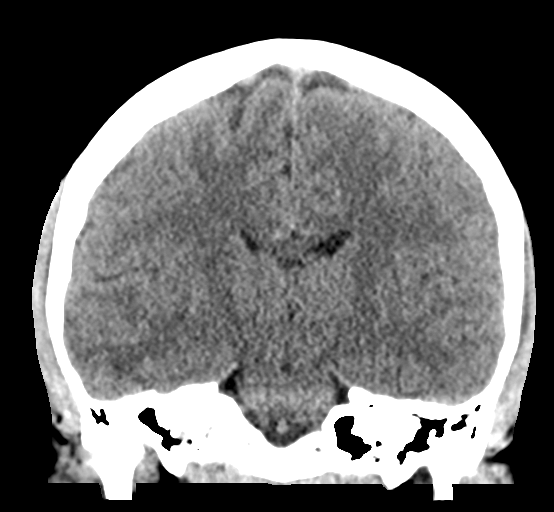
[im 35/63  brain]
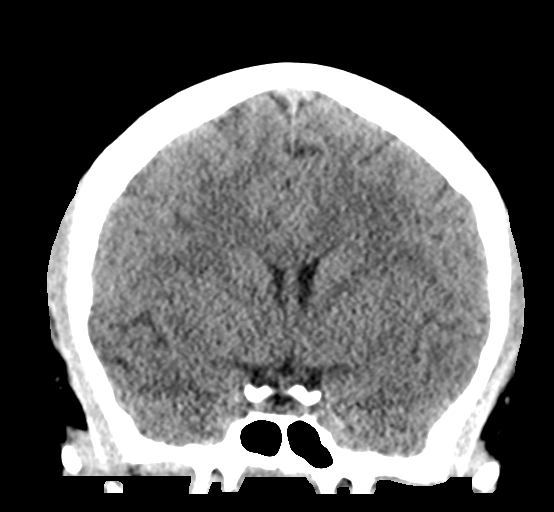

[Series 5: sagittal · sagittal · 0.29mm/px · 3 of 54 slices shown]
[im 18/54  brain]
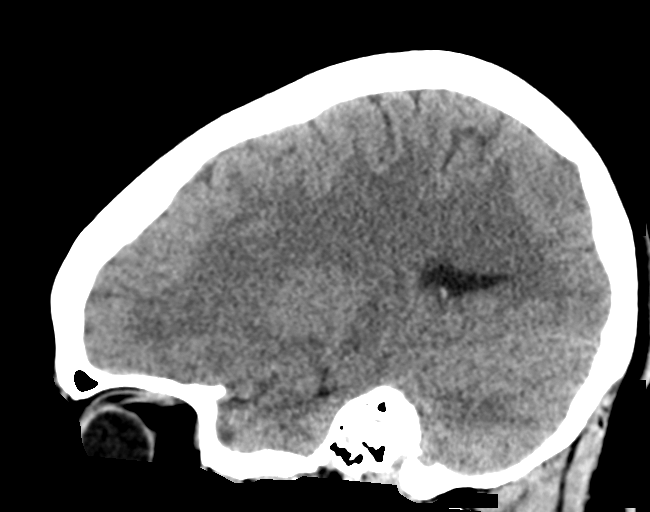
[im 27/54  brain]
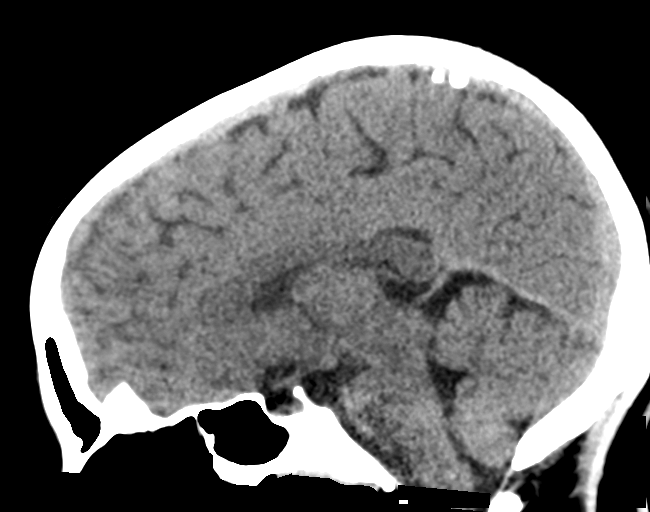
[im 36/54  brain]
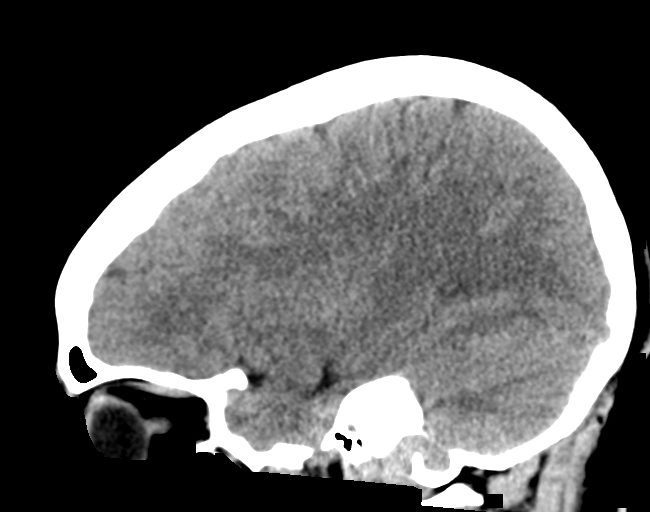

[16 of 47 positions shown; findings below may reference images not displayed]

FINDINGS: Brain: There is no intracranial hemorrhage, mass or evidence of
acute infarction. There is no extra-axial fluid collection. Gray
matter and white matter appear normal. Cerebral volume is normal for
age. Brainstem and posterior fossa are unremarkable. The CSF spaces
appear normal.

Vascular: No hyperdense vessel or unexpected calcification.

Skull: Normal. Negative for fracture or focal lesion.

Sinuses/Orbits: No acute finding.

Other: None.
IMPRESSION: Normal brain

## 2020-05-06 DIAGNOSIS — N644 Mastodynia: Secondary | ICD-10-CM | POA: Insufficient documentation

## 2021-03-18 DIAGNOSIS — R768 Other specified abnormal immunological findings in serum: Secondary | ICD-10-CM | POA: Insufficient documentation

## 2021-11-09 DIAGNOSIS — N6011 Diffuse cystic mastopathy of right breast: Secondary | ICD-10-CM | POA: Insufficient documentation

## 2021-12-24 DIAGNOSIS — D164 Benign neoplasm of bones of skull and face: Secondary | ICD-10-CM | POA: Insufficient documentation

## 2022-10-09 DIAGNOSIS — R102 Pelvic and perineal pain: Secondary | ICD-10-CM | POA: Diagnosis not present

## 2022-10-09 DIAGNOSIS — N898 Other specified noninflammatory disorders of vagina: Secondary | ICD-10-CM | POA: Diagnosis not present

## 2022-10-10 DIAGNOSIS — R197 Diarrhea, unspecified: Secondary | ICD-10-CM | POA: Diagnosis not present

## 2022-10-10 DIAGNOSIS — Z3202 Encounter for pregnancy test, result negative: Secondary | ICD-10-CM | POA: Diagnosis not present

## 2022-10-10 DIAGNOSIS — B3731 Acute candidiasis of vulva and vagina: Secondary | ICD-10-CM | POA: Diagnosis not present

## 2022-10-10 DIAGNOSIS — N898 Other specified noninflammatory disorders of vagina: Secondary | ICD-10-CM | POA: Diagnosis not present

## 2022-10-10 DIAGNOSIS — R300 Dysuria: Secondary | ICD-10-CM | POA: Diagnosis not present

## 2022-10-12 DIAGNOSIS — L910 Hypertrophic scar: Secondary | ICD-10-CM | POA: Diagnosis not present

## 2022-10-12 DIAGNOSIS — L089 Local infection of the skin and subcutaneous tissue, unspecified: Secondary | ICD-10-CM | POA: Diagnosis not present

## 2022-10-12 DIAGNOSIS — L2084 Intrinsic (allergic) eczema: Secondary | ICD-10-CM | POA: Diagnosis not present

## 2022-10-12 DIAGNOSIS — L308 Other specified dermatitis: Secondary | ICD-10-CM | POA: Diagnosis not present

## 2022-10-14 DIAGNOSIS — Z630 Problems in relationship with spouse or partner: Secondary | ICD-10-CM | POA: Diagnosis not present

## 2022-10-14 DIAGNOSIS — R69 Illness, unspecified: Secondary | ICD-10-CM | POA: Diagnosis not present

## 2022-11-11 DIAGNOSIS — M5441 Lumbago with sciatica, right side: Secondary | ICD-10-CM | POA: Diagnosis not present

## 2023-05-03 DIAGNOSIS — B3731 Acute candidiasis of vulva and vagina: Secondary | ICD-10-CM | POA: Diagnosis not present

## 2023-05-03 DIAGNOSIS — N898 Other specified noninflammatory disorders of vagina: Secondary | ICD-10-CM | POA: Diagnosis not present

## 2023-05-03 DIAGNOSIS — K5900 Constipation, unspecified: Secondary | ICD-10-CM | POA: Diagnosis not present

## 2023-05-03 DIAGNOSIS — Z113 Encounter for screening for infections with a predominantly sexual mode of transmission: Secondary | ICD-10-CM | POA: Diagnosis not present

## 2023-09-07 ENCOUNTER — Ambulatory Visit: Payer: No Typology Code available for payment source | Admitting: Dermatology

## 2023-09-07 VITALS — BP 129/85

## 2023-09-07 DIAGNOSIS — L910 Hypertrophic scar: Secondary | ICD-10-CM

## 2023-09-07 DIAGNOSIS — L309 Dermatitis, unspecified: Secondary | ICD-10-CM

## 2023-09-07 DIAGNOSIS — L299 Pruritus, unspecified: Secondary | ICD-10-CM | POA: Diagnosis not present

## 2023-09-07 MED ORDER — TRIAMCINOLONE ACETONIDE 40 MG/ML IJ SUSP
40.0000 mg | Freq: Once | INTRAMUSCULAR | Status: AC
  Administered 2023-09-07: 40 mg via INTRADERMAL

## 2023-09-07 MED ORDER — TRIAMCINOLONE ACETONIDE 0.1 % EX CREA: 1.0000 | TOPICAL_CREAM | Freq: Two times a day (BID) | CUTANEOUS | 1 refills | Status: DC

## 2023-09-07 NOTE — Progress Notes (Signed)
   New Patient Visit   Subjective  Pamela Valencia is a 27 y.o. female who presents for the following: Keloids -   Ms. Pamela Valencia presents with keloids at the port incision sites following gallbladder surgery in July. She reports that the keloids are painful and itchy. She has previously seen a doctor who recommended against injections, but she has had successful injections for a piercing keloid in the past.  The patient also mentions a small keloid on her back that causes discomfort when scratched. Additionally, she reports experiencing severe itching on her neck, suspecting it to be eczema.   The following portions of the chart were reviewed this encounter and updated as appropriate: medications, allergies, medical history  Review of Systems:  No other skin or systemic complaints except as noted in HPI or Assessment and Plan.  Objective  Well appearing patient in no apparent distress; mood and affect are within normal limits.   A focused examination was performed of the following areas: Abdomen  Relevant exam findings are noted in the Assessment and Plan.  Abdomen, upper back within tattoo (10) Firm pink/brown dermal papule(s)/plaque(s).                Assessment & Plan   1. [facility] - Assessment: Patient presents with keloids at port incision sites following gallbladder surgery in July, experiencing pain and itching. Previous successful treatment with injections for a piercing keloid reported by the patient. - Plan: Inject Kenalog 40 into keloids, totaling 0.6 mL across approximately 10 injections. Follow-up in 6-8 weeks for reassessment and potential further treatment.   2. Suspected Eczema on Neck - Assessment: Patient reports severe itching on the neck, suspected to be eczema. - Plan: Prescribe triamcinolone cream. Instructions: Apply twice daily for up to two weeks. Discontinue use if rash clears before two weeks. After two weeks  of use, take a minimum two-week break to avoid hypopigmentation. Send prescription to patient's pharmacy.   PROCEDURE NOTE Keloid (10) Abdomen, upper back within tattoo  Intralesional injection - Abdomen, upper back within tattoo (10) Location: abdomen, upper back  Informed Consent: Discussed risks (infection, pain, bleeding, bruising, thinning of the skin, loss of skin pigment, lack of resolution, and recurrence of lesion) and benefits of the procedure, as well as the alternatives. Informed consent was obtained. Preparation: The area was prepared a standard fashion.  Anesthesia:none  Procedure Details: An intralesional injection was performed with Kenalog 40 mg/cc. 0.6 cc in total were injected.  Total number of injections: 10  Plan: The patient was instructed on post-op care. Recommend OTC analgesia as needed for pain.   triamcinolone acetonide (KENALOG-40) injection 40 mg - Abdomen, upper back within tattoo (10)     Return for 6-8 weeks keloid.  I, Joanie Coddington, CMA, am acting as scribe for [employer], DO .   Documentation: I have reviewed the above documentation for accuracy and completeness, and I agree with the above.  Langston Reusing, DO

## 2023-09-07 NOTE — Progress Notes (Deleted)
   New Patient Visit   Subjective  Pamela Valencia is a 27 y.o. female who presents for the following: ***  Patient states {he/she/they:23295} has *** located at the {LOCATION ON BODY:21951} that {he/she/they:23295} would like to have examined. Patient reports the areas have been there for {NUMBER 1-10:22536} {Time; units week/month/year w plurals:19499}. {He/she (caps):30048} reports the areas {Actions; are/are not:16769} bothersome.Patient rates irritation {NUMBER 1-10:22536} out of 10. {He/she (caps):30048} states that the areas {ACTIONS; HAVE/HAVE NOT:19434} spread. Patient reports {he/she/they:23295} {HAS HAS VHQ:46962} previously been treated for these areas. Patient *** Hx of bx. Patient *** family history of skin cancer(s).  The patient has spots, moles and lesions to be evaluated, some may be new or changing and the patient may have concern these could be cancer.   The following portions of the chart were reviewed this encounter and updated as appropriate: medications, allergies, medical history  Review of Systems:  No other skin or systemic complaints except as noted in HPI or Assessment and Plan.  Objective  Well appearing patient in no apparent distress; mood and affect are within normal limits.  ***A full examination was performed including scalp, head, eyes, ears, nose, lips, neck, chest, axillae, abdomen, back, buttocks, bilateral upper extremities, bilateral lower extremities, hands, feet, fingers, toes, fingernails, and toenails. All findings within normal limits unless otherwise noted below.  ***A focused examination was performed of the following areas: ***  Relevant exam findings are noted in the Assessment and Plan.    Assessment & Plan       No follow-ups on file.  ***  Documentation: I have reviewed the above documentation for accuracy and completeness, and I agree with the above.  Langston Reusing, DO

## 2023-09-07 NOTE — Patient Instructions (Addendum)
Hello Tatyanna,  Thank you for visiting my office today. Your dedication to addressing your health concerns is greatly appreciated, and it was a pleasure to assist you with your keloid treatment. Here is a summary of the key instructions from today's appointment:  - Kenalog 40 Injections: Administered to the keloid sites to reduce itching, soften, flatten, and lighten the scars. A total of about 10 injections were given.  - Follow-Up Appointment: Scheduled in six to eight weeks to assess the full effects of the treatment.  - Prescription Cream: Triamcinolone cream prescribed for eczema on your neck.   - Application: Please use it twice a day for up to two weeks. If the rash clears sooner, you may stop using it.   - Duration: Remember to take a break for at least two weeks after the initial two-week period to avoid hypopigmentation.   - Pharmacy: The prescription will be sent to your pharmacy for your convenience.  Please feel free to reach out if you have any questions or concerns before our next appointment. Have a wonderful holiday season, and I look forward to seeing you again soon.  Warm regards,  Dr. Langston Reusing [facility]   Important Information  Due to recent changes in healthcare laws, you may see results of your pathology and/or laboratory studies on MyChart before the doctors have had a chance to review them. We understand that in some cases there may be results that are confusing or concerning to you. Please understand that not all results are received at the same time and often the doctors may need to interpret multiple results in order to provide you with the best plan of care or course of treatment. Therefore, we ask that you please give [place] 2 business days to thoroughly review all your results before contacting the office for clarification. Should we see a critical lab result, you will be contacted sooner.   If You Need Anything After Your Visit  If you have any  questions or concerns for your doctor, please call our main line at [phone] If no one answers, please leave a voicemail as directed and we will return your call as soon as possible. Messages left after 4 pm will be answered the following business day.   You may also send [place] a message via MyChart. We typically respond to MyChart messages within 1-2 business days.  For prescription refills, please ask your pharmacy to contact our office. Our fax number is [phone].  If you have an urgent issue when the clinic is closed that cannot wait until the next business day, you can page your doctor at the number below.    Please note that while we do our best to be available for urgent issues outside of office hours, we are not available 24/7.   If you have an urgent issue and are unable to reach [place], you may choose to seek medical care at your doctor's office, retail clinic, urgent care center, or emergency room.  If you have a medical emergency, please immediately call 911 or go to the emergency department. In the event of inclement weather, please call our main line at [phone] for an update on the status of any delays or closures.  Dermatology Medication Tips: Please keep the boxes that topical medications come in in order to help keep track of the instructions about where and how to use these. Pharmacies typically print the medication instructions only on the boxes and not directly on the medication tubes.  If your medication is too expensive, please contact our office at [phone] or send [place] a message through MyChart.   We are unable to tell what your co-pay for medications will be in advance as this is different depending on your insurance coverage. However, we may be able to find a substitute medication at lower cost or fill out paperwork to get insurance to cover a needed medication.   If a prior authorization is required to get your medication covered by your insurance company,  please allow [place] 1-2 business days to complete this process.  Drug prices often vary depending on where the prescription is filled and some pharmacies may offer cheaper prices.  The website www.goodrx.com contains coupons for medications through different pharmacies. The prices here do not account for what the cost may be with help from insurance (it may be cheaper with your insurance), but the website can give you the price if you did not use any insurance.  - You can print the associated coupon and take it with your prescription to the pharmacy.  - You may also stop by our office during regular business hours and pick up a GoodRx coupon card.  - If you need your prescription sent electronically to a different pharmacy, notify our office through [facility] or by phone at [phone]

## 2023-09-10 ENCOUNTER — Encounter: Payer: Self-pay | Admitting: Dermatology

## 2023-11-01 ENCOUNTER — Ambulatory Visit: Payer: No Typology Code available for payment source | Admitting: Dermatology

## 2023-11-01 ENCOUNTER — Encounter: Payer: Self-pay | Admitting: Dermatology

## 2023-11-01 VITALS — BP 121/81 | HR 109

## 2023-11-01 DIAGNOSIS — L910 Hypertrophic scar: Secondary | ICD-10-CM | POA: Diagnosis not present

## 2023-11-01 DIAGNOSIS — L209 Atopic dermatitis, unspecified: Secondary | ICD-10-CM | POA: Diagnosis not present

## 2023-11-01 DIAGNOSIS — L309 Dermatitis, unspecified: Secondary | ICD-10-CM

## 2023-11-01 MED ORDER — TRIAMCINOLONE ACETONIDE 0.1 % EX OINT: 1.0000 | TOPICAL_OINTMENT | Freq: Two times a day (BID) | CUTANEOUS | 3 refills | Status: AC | PRN

## 2023-11-01 MED ORDER — TRIAMCINOLONE ACETONIDE 40 MG/ML IJ SUSP
40.0000 mg | Freq: Once | INTRAMUSCULAR | Status: AC
  Administered 2023-11-01: 40 mg via INTRAMUSCULAR

## 2023-11-01 NOTE — Progress Notes (Signed)
   Follow-Up Visit   Subjective  Pamela Valencia is a 28 y.o. female who presents for the following: Keloid and Eczema  Patient present today for follow up visit for Keloids and eczema. Patient was last evaluated on 09/07/23. At this visit pt had ILK injections completed and was prescribed TMC to apply 2 times daily for 2 weeks then stop. Patient reports her Keloids are  improving & the eczema she would like to know if we could switch the [facility] to an ointment because it caused some burning when she applied the cream. Patient denies medication changes.  The following portions of the chart were reviewed this encounter and updated as appropriate: medications, allergies, medical history  Review of Systems:  No other skin or systemic complaints except as noted in HPI or Assessment and Plan.  Objective  Well appearing patient in no apparent distress; mood and affect are within normal limits.  A focused examination was performed of the following areas: Abdomen, Back and Belly Button  Relevant exam findings are noted in the Assessment and Plan.                Right Suprapubic Area Firm pink/brown dermal papule(s)  Assessment & Plan   ATOPIC DERMATITIS Exam: Faint Scaly plaques 2% BSA  Mild Flare  Atopic dermatitis (eczema) is a chronic, relapsing, pruritic condition that can significantly affect quality of life. It is often associated with allergic rhinitis and/or asthma and can require treatment with topical medications, phototherapy, or in severe cases biologic injectable medication (Dupixent; Adbry) or Oral JAK inhibitors.  Treatment Plan: - Due to burning with the TMC Cream we will plan to witch to [facility] 0.1% Ointment - Recommend gentle skin care  KELOID Exam: Firm brown dermal papule(s).  Assessment: Patient shows improvement in keloids on the abdomen, which are less active, not red or puffing, and beginning to involute. A new keloid has formed on the back from a scratched  bump. Previous keloids from gallbladder surgery are responding better to treatment.  Plan: Administer intralesional injections to keloids, including the new one on the back, with a total of 9 injections using 0.8 units of medication. Schedule a follow-up appointment in 2 months.  KELOID Right Suprapubic Area Procedure Note Intralesional Injection  Location: Abdomen, Flank, Suprapubic Area  Informed Consent: Discussed risks (infection, pain, bleeding, bruising, thinning of the skin, loss of skin pigment, lack of resolution, and recurrence of lesion) and benefits of the procedure, as well as the alternatives. Informed consent was obtained. Preparation: The area was prepared a standard fashion.  Anesthesia: None  Procedure Details: An intralesional injection was performed with Kenalog 40 mg/cc. 0.8 cc in total were injected. NDC #: 4098-1191-47 Exp: May 2026  Total number of injections: 9  Plan: The patient was instructed on post-op care. Recommend OTC analgesia as needed for pain.  triamcinolone acetonide (KENALOG-40) injection 40 mg - Right Suprapubic Area  ECZEMA, UNSPECIFIED TYPE   Related Medications triamcinolone ointment (KENALOG) 0.1 % Apply 1 Application topically 2 (two) times daily as needed.  Return in about 8 weeks (around 12/27/2023) for [facility] and Eczema F/U.  Documentation: I have reviewed the above documentation for accuracy and completeness, and I agree with the above.  Stasia Cavalier, am acting as scribe for Langston Reusing, DO.   Langston Reusing, DO

## 2023-11-01 NOTE — Patient Instructions (Addendum)
Hello Volanda ,  Thank you for visiting my office today. Here is a summary of our discussion and the treatment plan moving forward:  Keloids Treatment: The new keloid on your back and the other keloids on your abdomen were also treated with injections. Continue to monitor the keloids on your abdomen, as they are showing signs of improvement.  Eczema Management:   Medication Change: Switch to Triamcinolone ointment instead of cream to reduce the burning sensation.     Application: Apply twice daily on active flares.     Duration: Use for up to 2 weeks, then take a 2-week break before resuming if necessary.   Shower Temperature: Ensure the water temperature during showers is not too high.   Moisturizing: Apply moisturizer immediately after showering and seal it with Vaseline or use Aquaphor's spray.  Follow-Up Appointments:   Progress Assessment: We will see you in 2 months to assess the progress of the keloid treatments.   Specialist Consultation: Schedule a visit with an ENT specialist to evaluate the growth in your ear.  Thank you once again for your visit today. Please follow the treatment plan as discussed, and do not hesitate to contact the office if you have any questions or concerns.  Best regards,  Dr. Langston Reusing, Dermatology    Important Information   Due to recent changes in healthcare laws, you may see results of your pathology and/or laboratory studies on MyChart before the doctors have had a chance to review them. We understand that in some cases there may be results that are confusing or concerning to you. Please understand that not all results are received at the same time and often the doctors may need to interpret multiple results in order to provide you with the best plan of care or course of treatment. Therefore, we ask that you please give [place] 2 business days to thoroughly review all your results before contacting the office for clarification. Should we see a critical  lab result, you will be contacted sooner.     If You Need Anything After Your Visit   If you have any questions or concerns for your doctor, please call our main line at [phone]. If no one answers, please leave a voicemail as directed and we will return your call as soon as possible. Messages left after 4 pm will be answered the following business day.    You may also send [place] a message via MyChart. We typically respond to MyChart messages within 1-2 business days.  For prescription refills, please ask your pharmacy to contact our office. Our fax number is [phone].  If you have an urgent issue when the clinic is closed that cannot wait until the next business day, you can page your doctor at the number below.     Please note that while we do our best to be available for urgent issues outside of office hours, we are not available 24/7.    If you have an urgent issue and are unable to reach [place], you may choose to seek medical care at your doctor's office, retail clinic, urgent care center, or emergency room.   If you have a medical emergency, please immediately call 911 or go to the emergency department. In the event of inclement weather, please call our main line at [phone] for an update on the status of any delays or closures.  Dermatology Medication Tips: Please keep the boxes that topical medications come in in order to help keep track of the  instructions about where and how to use these. Pharmacies typically print the medication instructions only on the boxes and not directly on the medication tubes.   If your medication is too expensive, please contact our office at [phone] or send [place] a message through MyChart.    We are unable to tell what your co-pay for medications will be in advance as this is different depending on your insurance coverage. However, we may be able to find a substitute medication at lower cost or fill out paperwork to get insurance to cover a needed  medication.    If a prior authorization is required to get your medication covered by your insurance company, please allow [place] 1-2 business days to complete this process.   Drug prices often vary depending on where the prescription is filled and some pharmacies may offer cheaper prices.   The website www.goodrx.com contains coupons for medications through different pharmacies. The prices here do not account for what the cost may be with help from insurance (it may be cheaper with your insurance), but the website can give you the price if you did not use any insurance.  - You can print the associated coupon and take it with your prescription to the pharmacy.  - You may also stop by our office during regular business hours and pick up a GoodRx coupon card.  - If you need your prescription sent electronically to a different pharmacy, notify our office through [facility] or by phone at [phone]

## 2023-11-02 ENCOUNTER — Ambulatory Visit: Payer: No Typology Code available for payment source | Admitting: Family Medicine

## 2023-11-02 ENCOUNTER — Encounter: Payer: Self-pay | Admitting: Family Medicine

## 2023-11-02 VITALS — BP 122/74 | HR 101 | Temp 99.1°F | Ht 65.35 in | Wt 173.4 lb

## 2023-11-02 DIAGNOSIS — K5909 Other constipation: Secondary | ICD-10-CM

## 2023-11-02 DIAGNOSIS — E559 Vitamin D deficiency, unspecified: Secondary | ICD-10-CM

## 2023-11-02 DIAGNOSIS — Z7689 Persons encountering health services in other specified circumstances: Secondary | ICD-10-CM

## 2023-11-02 DIAGNOSIS — Z862 Personal history of diseases of the blood and blood-forming organs and certain disorders involving the immune mechanism: Secondary | ICD-10-CM | POA: Diagnosis not present

## 2023-11-02 DIAGNOSIS — R110 Nausea: Secondary | ICD-10-CM

## 2023-11-02 DIAGNOSIS — Z9049 Acquired absence of other specified parts of digestive tract: Secondary | ICD-10-CM

## 2023-11-02 DIAGNOSIS — J302 Other seasonal allergic rhinitis: Secondary | ICD-10-CM

## 2023-11-02 DIAGNOSIS — Z23 Encounter for immunization: Secondary | ICD-10-CM | POA: Diagnosis not present

## 2023-11-02 DIAGNOSIS — Z8619 Personal history of other infectious and parasitic diseases: Secondary | ICD-10-CM

## 2023-11-02 DIAGNOSIS — R768 Other specified abnormal immunological findings in serum: Secondary | ICD-10-CM

## 2023-11-02 LAB — CBC WITH DIFFERENTIAL/PLATELET
Basophils Absolute: 0 10*3/uL (ref 0.0–0.1)
Basophils Relative: 0.2 % (ref 0.0–3.0)
Eosinophils Absolute: 0 10*3/uL (ref 0.0–0.7)
Eosinophils Relative: 0.2 % (ref 0.0–5.0)
HCT: 40.2 % (ref 36.0–46.0)
Hemoglobin: 13.6 g/dL (ref 12.0–15.0)
Lymphocytes Relative: 10.2 % — ABNORMAL LOW (ref 12.0–46.0)
Lymphs Abs: 1.6 10*3/uL (ref 0.7–4.0)
MCHC: 33.7 g/dL (ref 30.0–36.0)
MCV: 92.8 fL (ref 78.0–100.0)
Monocytes Absolute: 1.4 10*3/uL — ABNORMAL HIGH (ref 0.1–1.0)
Monocytes Relative: 9.1 % (ref 3.0–12.0)
Neutro Abs: 12.4 10*3/uL — ABNORMAL HIGH (ref 1.4–7.7)
Neutrophils Relative %: 80.3 % — ABNORMAL HIGH (ref 43.0–77.0)
Platelets: 429 10*3/uL — ABNORMAL HIGH (ref 150.0–400.0)
RBC: 4.33 Mil/uL (ref 3.87–5.11)
RDW: 12.4 % (ref 11.5–15.5)
WBC: 15.5 10*3/uL — ABNORMAL HIGH (ref 4.0–10.5)

## 2023-11-02 LAB — COMPREHENSIVE METABOLIC PANEL
ALT: 19 U/L (ref 0–35)
AST: 21 U/L (ref 0–37)
Albumin: 4.5 g/dL (ref 3.5–5.2)
Alkaline Phosphatase: 82 U/L (ref 39–117)
BUN: 10 mg/dL (ref 6–23)
CO2: 26 meq/L (ref 19–32)
Calcium: 10 mg/dL (ref 8.4–10.5)
Chloride: 104 meq/L (ref 96–112)
Creatinine, Ser: 0.84 mg/dL (ref 0.40–1.20)
GFR: 96.33 mL/min (ref >60.00)
Glucose, Bld: 89 mg/dL (ref 70–99)
Potassium: 4.3 meq/L (ref 3.5–5.1)
Sodium: 139 meq/L (ref 135–145)
Total Bilirubin: 0.4 mg/dL (ref 0.2–1.2)
Total Protein: 8 g/dL (ref 6.0–8.3)

## 2023-11-02 LAB — VITAMIN D 25 HYDROXY (VIT D DEFICIENCY, FRACTURES): VITD: 15.1 ng/mL — ABNORMAL LOW (ref 30.00–100.00)

## 2023-11-02 NOTE — Patient Instructions (Signed)
It was nice meeting you today.  Orders for labs were placed.

## 2023-11-02 NOTE — Progress Notes (Signed)
Established Patient Office Visit   Subjective  Patient ID: Pamela Valencia, female    DOB: Jun 25, 1996  Age: 28 y.o. MRN: [id number]  Chief Complaint  Patient presents with   New Patient (Initial Visit)    Patient is a 28 year old female seen to establish care and follow-up on ongoing concerns.  Patient states she was previously seen at [facility].  Nausea: Patient notes increased nausea in the last few months.  S/p lap chole last year.  Had nausea prior to procedure that resolved after.  Greasy foods cause symptoms.  Patient has not paid attention if other foods cause symptoms.  Patient tried electrolyte packets in water.  Constipation: Patient notes history of chronic constipation as a kid.  Symptoms have since improved with changes to diet.  Drinking at least a half a gallon of water per day.  Was drinking a gallon of water but felt like it was too much.  Exercising daily.  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  Blood work done after a shingles outbreak on right shoulder.  Patient mentions history of anemia.  Inquires about vitamin testing.  Seasonal allergies: Will take OTC Zyrtec as needed.  History of shingles: Patient notes recurrent shingles outbreaks typically on right posterior shoulder.  If takes lysine at first sign of symptoms can prevent outbreak.  Typically has tingling/burning sensation prior to appearance of lesions.  Patient notes increased stress around times of outbreaks.  Patient has not had a shingles vaccine.  Allergies: Amoxicillin/penicillin: Intolerance-causes diarrhea Peanuts: Hives  Past surgical history: Lap chole  Social history: Patient is single.  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  Social alcohol use.  Patient denies tobacco and drug use.  Family medical history: MGM-lung  cancer, tobacco use Mom-HTN Dad-heart issues, CHF?     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   Mastalgia 05/06/2020   Past Medical History:  Diagnosis Date   Acid reflux    Allergy    Asthma    Past Surgical History:  Procedure Laterality Date   CHOLECYSTECTOMY  7/15   Social History   Tobacco Use   Smoking status: Never    Passive exposure: Never   Smokeless tobacco: Never  Vaping Use   Vaping status: Never Used  Substance Use Topics   Alcohol use: Yes    Comment: 1 drink per 2 weeks   Drug use: No   Family History  Problem Relation Age of Onset   Diabetes Maternal Grandfather    Cancer Maternal Grandmother    Miscarriages / Stillbirths Maternal Grandmother    Allergies  Allergen Reactions   Other Anaphylaxis    Peanuts   Latex Other (See Comments)    Rash/itching   Metronidazole Itching    With burning sensation- vaginal gel.   With burning sensation- vaginal gel.   Peanut-Containing Drug Products Hives   Penicillins Diarrhea and Nausea And Vomiting    Has patient had a PCN reaction causing immediate rash, facial/tongue/throat swelling, SOB or lightheadedness with hypotension: No Has patient had a PCN reaction causing severe rash involving mucus membranes or skin necrosis: No Has patient had a PCN reaction that required hospitalization No Has patient had a PCN reaction occurring within  the last 10 years: Yes If all of the above answers are "NO", then may proceed with Cephalosporin use.       ROS Negative unless stated above    Objective:     BP 122/74 (BP Location: Right Arm, Patient Position: Sitting, Cuff Size: Normal)   Pulse (!) 101   Temp 99.1 F (37.3 C) (Oral)   Ht 5' 5.35" (1.66 m)   Wt 173 lb 6.4 oz (78.7 kg)   LMP 10/07/2023 (Exact Date)   SpO2 98%   BMI 28.54 kg/m    Physical Exam Constitutional:       General: She is not in acute distress.    Appearance: Normal appearance.  HENT:     Head: Normocephalic and atraumatic.     Nose: Nose normal.     Comments: Allergic crease across nasal bridge.    Mouth/Throat:     Mouth: Mucous membranes are moist.  Eyes:     Extraocular Movements: Extraocular movements intact.     Conjunctiva/sclera: Conjunctivae normal.  Cardiovascular:     Rate and Rhythm: Normal rate and regular rhythm.     Heart sounds: Normal heart sounds. No murmur heard.    No gallop.  Pulmonary:     Effort: Pulmonary effort is normal. No respiratory distress.     Breath sounds: Normal breath sounds. No wheezing, rhonchi or rales.  Abdominal:     General: Bowel sounds are normal.     Palpations: Abdomen is soft.  Musculoskeletal:        General: Normal range of motion.  Skin:    General: Skin is warm and dry.  Neurological:     Mental Status: She is alert and oriented to person, place, and time.      No results found for any visits on 11/02/23.    Assessment & Plan:  Nausea -Discussed possible causes including decreased bile due to gallbladder removal, GERD, constipation, pregnancy, etc. -Patient to keep food diary -Avoid greasy, spicy foods -Obtain labs -For continued worsening symptoms refer to GI  Encounter to establish care -We reviewed the PMH, PSH, FH, SH, Meds and Allergies. -We provided refills for any medications we will prescribe as needed. -We addressed current concerns per orders and patient instructions. -We have asked for records for pertinent exams, studies, vaccines and notes from previous providers. -We have advised patient to follow up per instructions below.  Seasonal allergies -OTC antihistamines as needed  Status post cholecystectomy -Avoid greasy, fried foods  Chronic constipation -     CBC with Differential/Platelet -     Comprehensive metabolic panel  HSV-2 seropositive -HSV-2 positive blood test with no history of oral  genital lesions  History of shingles -Consider shingles vaccine -Discussed ways to prevent breaks including decreasing stress, getting plenty of rest, and eating properly -Continue lysine at start of outbreak. -For uncontrolled outbreaks antiviral medication if needed  History of anemia -     CBC with Differential/Platelet -     Iron, TIBC and Ferritin Panel  Vitamin D deficiency -     VITAMIN D 25 Hydroxy (Vit-D Deficiency, Fractures)  Need for Tdap vaccination -     Tdap vaccine greater than or equal to 7yo IM  Need for influenza vaccination -     Flu vaccine trivalent PF, 6mos and older(Flulaval,Afluria,Fluarix,Fluzone)   Return if symptoms worsen or fail to improve.   Deeann Saint, MD

## 2023-11-03 ENCOUNTER — Other Ambulatory Visit: Payer: Self-pay | Admitting: Family Medicine

## 2023-11-03 ENCOUNTER — Encounter: Payer: Self-pay | Admitting: Family Medicine

## 2023-11-03 ENCOUNTER — Telehealth: Payer: Self-pay

## 2023-11-03 DIAGNOSIS — E559 Vitamin D deficiency, unspecified: Secondary | ICD-10-CM

## 2023-11-03 LAB — IRON,TIBC AND FERRITIN PANEL
%SAT: 32 % (ref 16–45)
Ferritin: 68 ng/mL (ref 16–154)
Iron: 127 ug/dL (ref 40–190)
TIBC: 391 ug/dL (ref 250–450)

## 2023-11-03 MED ORDER — VITAMIN D (ERGOCALCIFEROL) 1.25 MG (50000 UNIT) PO CAPS: 50000.0000 [IU] | ORAL_CAPSULE | ORAL | 0 refills | Status: DC

## 2023-11-03 NOTE — Telephone Encounter (Signed)
Copied from CRM [id number]. Topic: Clinical - Lab/Test Results >> Nov 03, 2023 10:51 AM Dennison Nancy wrote: Reason for CRM: Patient requesting a callback have questions regarding her lab results for cbc

## 2023-11-03 NOTE — Telephone Encounter (Signed)
Called patient, patient is aware I will call back and go over labs when results are ready.

## 2023-11-07 ENCOUNTER — Other Ambulatory Visit: Payer: Self-pay

## 2023-11-07 DIAGNOSIS — E559 Vitamin D deficiency, unspecified: Secondary | ICD-10-CM

## 2023-12-22 ENCOUNTER — Ambulatory Visit: Admitting: Family Medicine

## 2023-12-28 ENCOUNTER — Ambulatory Visit: Payer: No Typology Code available for payment source | Admitting: Dermatology

## 2023-12-28 ENCOUNTER — Encounter: Payer: Self-pay | Admitting: Dermatology

## 2023-12-28 DIAGNOSIS — L910 Hypertrophic scar: Secondary | ICD-10-CM | POA: Diagnosis not present

## 2023-12-28 DIAGNOSIS — B029 Zoster without complications: Secondary | ICD-10-CM

## 2023-12-28 MED ORDER — TRIAMCINOLONE ACETONIDE 40 MG/ML IJ SUSP
40.0000 mg | Freq: Once | INTRAMUSCULAR | Status: AC
  Administered 2023-12-28: 40 mg via INTRADERMAL

## 2023-12-28 MED ORDER — VALACYCLOVIR HCL 500 MG PO TABS: 500.0000 mg | ORAL_TABLET | Freq: Every day | ORAL | 0 refills | Status: DC

## 2023-12-28 NOTE — Progress Notes (Signed)
 Follow-Up Visit   Subjective  Pamela Valencia is a 28 y.o. female who presents for the following: Keloid and Eczema   Patient present today for follow up visit for Keloid. Patient was last evaluated on 11/01/23. At this visit patient was prescribed triamcinolone ointement once a day . Patient reports sxs are better. Patient denies medication changes.   The patient presents with keloids on the abdomen. The keloids have improved but are not completely flat. The patient reports they may still need injections.  The patient also reports a possible case of shingles. She has a history of shingles and noticed a cluster of vesicles on her right posterior shoulder yesterday. The patient is experiencing burning sensations in the affected area. She reports being under significant stress due to work and school commitments, which may have contributed to the reactivation of the virus. The patient is currently pursuing a PhD in healthcare administration, studying abuse in nursing homes, which she finds emotionally taxing and logistically challenging.  The following portions of the chart were reviewed this encounter and updated as appropriate: medications, allergies, medical history  Review of Systems:  No other skin or systemic complaints except as noted in HPI or Assessment and Plan.  Objective  Well appearing patient in no apparent distress; mood and affect are within normal limits.   A focused examination was performed of the following areas: Abdomen  Relevant exam findings are noted in the Assessment and Plan.  Left Abdomen (side) - Lower, Right Abdomen (side) - Lower Firm pink/brown dermal papule(s)/plaque(s).   Assessment & Plan   1. Herpes Zoster (Shingles) - Assessment: Cluster of vesicles on the right posterior shoulder, likely T1 or T2 dermatome nerve, appeared yesterday. Patient reports burning sensation and has a history of shingles. Differential diagnosis includes herpes simplex  virus (HSV), but clinician is diagnosing as herpes zoster. - Plan:    Prescribe valacyclovir 1 gram daily for 7 days    Advise patient to keep the affected area covered    Caution patient about potential transmission to unvaccinated children    Recommend using a bandaid on the affected area    Inform patient that the condition is no longer contagious once scabby (in a couple of days)  2. Keloids - Assessment: Keloids on the abdomen have shown improvement but still present as firm scar nodules. The keloids are currently flat but require further treatment. - Plan:    Administer Kenalog-40 injections: 8 injections, total 0.3 cc    Continue using strong Kenalog to avoid over-injection and potential atrophy    Schedule follow-up appointment in 8 weeks  Follow-up in 8 weeks for reassessment of keloids and evaluation of treatment efficacy.     KELOID (2) Left Abdomen (side) - Lower, Right Abdomen (side) - Lower Intralesional injection - Left Abdomen (side) - Lower, Right Abdomen (side) - Lower   Procedure Note Intralesional Injection  Location: Abdomen  Informed Consent: Discussed risks (infection, pain, bleeding, bruising, thinning of the skin, loss of skin pigment, lack of resolution, and recurrence of lesion) and benefits of the procedure, as well as the alternatives. Informed consent was obtained. Preparation: The area was prepared a standard fashion.  Anesthesia:none  Procedure Details: An intralesional injection was performed with Kenalog 40 mg/cc. Marland Kitchen3 cc in total were injected. NDC #: [address]-6 Exp: 03/02/2025 Lot # : [id number]  Total number of injections: 8  Plan: The patient was instructed on post-op care. Recommend OTC analgesia as needed for pain.   Related Medications  triamcinolone acetonide (KENALOG-40) injection 40 mg   No follow-ups on file.  Samara Deist, CMA, am acting as scribe for [employer], DO.   Documentation: I have reviewed the above  documentation for accuracy and completeness, and I agree with the above.  Langston Reusing, DO

## 2023-12-28 NOTE — Patient Instructions (Signed)
 Dear Ms. Prentiss Bells,  Thank you for visiting today. Here is a summary of the key instructions:  - Medications:   - Take valacyclovir 1 gram daily for 7 days for shingles  - Wound Care:   - Keep the shingles rash covered with a bandaid   - Be careful not to scratch the rash  - Precautions:   - Avoid direct contact with unvaccinated children, especially those under 26 years old   - Wash hands after touching the rash to prevent spreading the virus  - Follow-up:   - Return for a follow-up appointment in 8 weeks for keloid treatment  Please reach out if you have any questions or concerns.  Best Regards,  Dr. Langston Reusing Dermatology    Important Information  Due to recent changes in healthcare laws, you may see results of your pathology and/or laboratory studies on MyChart before the doctors have had a chance to review them. We understand that in some cases there may be results that are confusing or concerning to you. Please understand that not all results are received at the same time and often the doctors may need to interpret multiple results in order to provide you with the best plan of care or course of treatment. Therefore, we ask that you please give [place] 2 business days to thoroughly review all your results before contacting the office for clarification. Should we see a critical lab result, you will be contacted sooner.   If You Need Anything After Your Visit  If you have any questions or concerns for your doctor, please call our main line at [phone] If no one answers, please leave a voicemail as directed and we will return your call as soon as possible. Messages left after 4 pm will be answered the following business day.   You may also send [place] a message via MyChart. We typically respond to MyChart messages within 1-2 business days.  For prescription refills, please ask your pharmacy to contact our office. Our fax number is [phone].  If you have an urgent issue when  the clinic is closed that cannot wait until the next business day, you can page your doctor at the number below.    Please note that while we do our best to be available for urgent issues outside of office hours, we are not available 24/7.   If you have an urgent issue and are unable to reach [place], you may choose to seek medical care at your doctor's office, retail clinic, urgent care center, or emergency room.  If you have a medical emergency, please immediately call 911 or go to the emergency department. In the event of inclement weather, please call our main line at [phone] for an update on the status of any delays or closures.  Dermatology Medication Tips: Please keep the boxes that topical medications come in in order to help keep track of the instructions about where and how to use these. Pharmacies typically print the medication instructions only on the boxes and not directly on the medication tubes.   If your medication is too expensive, please contact our office at [phone] or send [place] a message through MyChart.   We are unable to tell what your co-pay for medications will be in advance as this is different depending on your insurance coverage. However, we may be able to find a substitute medication at lower cost or fill out paperwork to get insurance to cover a needed medication.   If a prior authorization  is required to get your medication covered by your insurance company, please allow [place] 1-2 business days to complete this process.  Drug prices often vary depending on where the prescription is filled and some pharmacies may offer cheaper prices.  The website www.goodrx.com contains coupons for medications through different pharmacies. The prices here do not account for what the cost may be with help from insurance (it may be cheaper with your insurance), but the website can give you the price if you did not use any insurance.  - You can print the associated coupon and take it  with your prescription to the pharmacy.  - You may also stop by our office during regular business hours and pick up a GoodRx coupon card.  - If you need your prescription sent electronically to a different pharmacy, notify our office through [facility] or by phone at [phone]

## 2024-01-28 ENCOUNTER — Other Ambulatory Visit: Payer: Self-pay | Admitting: Family Medicine

## 2024-01-28 DIAGNOSIS — E559 Vitamin D deficiency, unspecified: Secondary | ICD-10-CM

## 2024-02-21 ENCOUNTER — Ambulatory Visit: Admitting: Dermatology

## 2024-03-07 ENCOUNTER — Ambulatory Visit: Admitting: Family Medicine

## 2024-04-15 ENCOUNTER — Ambulatory Visit: Admitting: Dermatology

## 2024-06-14 ENCOUNTER — Encounter: Payer: Self-pay | Admitting: Family Medicine

## 2024-06-14 ENCOUNTER — Other Ambulatory Visit (HOSPITAL_COMMUNITY)
Admission: RE | Admit: 2024-06-14 | Discharge: 2024-06-14 | Disposition: A | Discharge status: Home / Self Care | Source: Ambulatory Visit | Attending: Family Medicine | Admitting: Family Medicine

## 2024-06-14 ENCOUNTER — Ambulatory Visit (INDEPENDENT_AMBULATORY_CARE_PROVIDER_SITE_OTHER): Admitting: Family Medicine

## 2024-06-14 VITALS — BP 130/82 | HR 87 | Temp 98.6°F | Ht 65.35 in | Wt 181.2 lb

## 2024-06-14 DIAGNOSIS — R300 Dysuria: Secondary | ICD-10-CM | POA: Diagnosis not present

## 2024-06-14 DIAGNOSIS — B9689 Other specified bacterial agents as the cause of diseases classified elsewhere: Secondary | ICD-10-CM | POA: Diagnosis not present

## 2024-06-14 DIAGNOSIS — N760 Acute vaginitis: Secondary | ICD-10-CM

## 2024-06-14 LAB — POC URINALSYSI DIPSTICK (AUTOMATED)
Bilirubin, UA: NEGATIVE
Blood, UA: POSITIVE
Glucose, UA: NEGATIVE
Ketones, UA: NEGATIVE
Leukocytes, UA: NEGATIVE
Nitrite, UA: NEGATIVE
Protein, UA: NEGATIVE
Spec Grav, UA: 1.02 (ref 1.010–1.025)
Urobilinogen, UA: 0.2 U/dL
pH, UA: 6 (ref 5.0–8.0)

## 2024-06-14 NOTE — Progress Notes (Signed)
 Established Patient Office Visit   Subjective  Patient ID: Pamela Valencia, female    DOB: [birth year]  Age: 28 y.o. MRN: [id number]  Chief Complaint  Patient presents with   Acute Visit    Patient came in today for vaginal itching and burning with Urination, started sat after patient got her menses     Patient is a 28 year old female seen for acute concern.  Patient with vaginal irritation burning with urination 6 days.  Symptoms started with menses and improved upon completion.  Patient used boric acid suppositories as symptoms may be associated with BV.  Patient denies changes in soaps, lotions, detergents, discharge, rash, or intercourse.  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   Mastalgia 05/06/2020   Past Medical History:  Diagnosis Date   Acid reflux    Allergy    Asthma    Past Surgical History:  Procedure Laterality Date   CHOLECYSTECTOMY  7/15   Social History   Tobacco Use   Smoking status: Never    Passive exposure: Never   Smokeless tobacco: Never  Vaping Use   Vaping status: Never Used  Substance Use Topics   Alcohol use: Yes    Comment: 1 drink per 2 weeks   Drug use: No   Family History  Problem Relation Age of Onset   Diabetes Maternal Grandfather    Cancer Maternal Grandmother    Miscarriages / Stillbirths Maternal Grandmother    Allergies  Allergen Reactions   Other Anaphylaxis    Peanuts   Latex Other (See Comments)    Rash/itching   Metronidazole Itching    With burning sensation- vaginal gel.   With burning sensation- vaginal gel.   Peanut-Containing Drug Products Hives   Penicillins Diarrhea and Nausea And Vomiting    Has patient had a PCN reaction causing immediate rash, facial/tongue/throat swelling, SOB or lightheadedness with  hypotension: No Has patient had a PCN reaction causing severe rash involving mucus membranes or skin necrosis: No Has patient had a PCN reaction that required hospitalization No Has patient had a PCN reaction occurring within the last 10 years: Yes If all of the above answers are NO, then may proceed with Cephalosporin use.     ROS Negative unless stated above    Objective:     BP 130/82 (BP Location: Left Arm, Patient Position: Sitting, Cuff Size: Normal)   Pulse 87   Temp 98.6 F (37 C) (Oral)   Ht 5' 5.35 (1.66 m)   Wt 181 lb 3.2 oz (82.2 kg)   LMP 06/08/2024 (Exact Date)   SpO2 100%   BMI 29.83 kg/m  BP Readings from Last 3 Encounters:  06/14/24 130/82  11/02/23 122/74  11/01/23 121/81   Wt Readings from Last 3 Encounters:  06/14/24 181 lb 3.2 oz (82.2 kg)  11/02/23 173 lb 6.4 oz (78.7 kg)  04/17/18 162 lb (73.5 kg)      Physical Exam Constitutional:      General: She is not in acute distress.    Appearance: Normal appearance.  HENT:     Head: Normocephalic and atraumatic.     Nose: Nose normal.     Mouth/Throat:     Mouth: Mucous membranes are moist.  Cardiovascular:     Rate and Rhythm: Normal rate and regular  rhythm.  Pulmonary:     Effort: Pulmonary effort is normal.  Genitourinary:    Comments: Aptima self swab collected. Skin:    General: Skin is warm and dry.  Neurological:     Mental Status: She is alert and oriented to person, place, and time. Mental status is at baseline.        11/02/2023    9:18 AM  Depression screen PHQ 2/9  Decreased Interest 0  Down, Depressed, Hopeless 0  PHQ - 2 Score 0  Altered sleeping 0  Tired, decreased energy 1  Change in appetite 0  Feeling bad or failure about yourself  0  Trouble concentrating 0  Moving slowly or fidgety/restless 0  Suicidal thoughts 0  PHQ-9 Score 1  Difficult doing work/chores Not difficult at all      11/02/2023    9:18 AM  GAD 7 : Generalized Anxiety Score  Nervous,  Anxious, on Edge 0  Control/stop worrying 0  Worry too much - different things 0  Trouble relaxing 0  Restless 0  Easily annoyed or irritable 0  Afraid - awful might happen 0  Total GAD 7 Score 0  Anxiety Difficulty Not difficult at all     Results for orders placed or performed in visit on 06/14/24  POCT Urinalysis Dipstick (Automated)  Result Value Ref Range   Color, UA yellow    Clarity, UA clear    Glucose, UA Negative Negative   Bilirubin, UA neg    Ketones, UA neg    Spec Grav, UA 1.020 1.010 - 1.025   Blood, UA pos    pH, UA 6.0 5.0 - 8.0   Protein, UA Negative Negative   Urobilinogen, UA 0.2 0.2 or 1.0 E.U./dL   Nitrite, UA neg    Leukocytes, UA Negative Negative      Assessment & Plan:   Acute vaginitis -     Cervicovaginal ancillary only  Dysuria -     POCT Urinalysis Dipstick (Automated)  Aptima self swab collected.  Will wait on results for further recommendations.    Update:  results with BV.  Allergy to metronidazole gel.  Rx for clindamycin  gel sent in.  Return if symptoms worsen or fail to improve.   Clotilda JONELLE Single, MD

## 2024-06-18 LAB — CERVICOVAGINAL ANCILLARY ONLY
Bacterial Vaginitis (gardnerella): POSITIVE — AB
Candida Glabrata: NEGATIVE
Candida Vaginitis: NEGATIVE
Chlamydia: NEGATIVE
Comment: NEGATIVE
Comment: NEGATIVE
Comment: NEGATIVE
Comment: NEGATIVE
Comment: NEGATIVE
Comment: NORMAL
Neisseria Gonorrhea: NEGATIVE
Trichomonas: NEGATIVE

## 2024-06-19 ENCOUNTER — Telehealth: Payer: Self-pay

## 2024-06-19 MED ORDER — CLINDAMYCIN PHOSPHATE 2 % VA CREA: 1.0000 | TOPICAL_CREAM | Freq: Every day | VAGINAL | 0 refills | Status: AC

## 2024-06-19 NOTE — Telephone Encounter (Signed)
 Copied from CRM [id number]. Topic: Clinical - Medical Advice >> Jun 19, 2024 12:48 PM Harlene ORN wrote: Reason for CRM: Patient called. Was tested positive for BV. Would like to receive a prescription for treatment.

## 2024-07-03 ENCOUNTER — Ambulatory Visit: Admitting: Dermatology

## 2024-09-06 ENCOUNTER — Ambulatory Visit: Admitting: Family Medicine

## 2024-09-06 VITALS — Temp 98.1°F | Ht 65.35 in | Wt 187.8 lb

## 2024-09-06 DIAGNOSIS — R42 Dizziness and giddiness: Secondary | ICD-10-CM | POA: Diagnosis not present

## 2024-09-06 DIAGNOSIS — R635 Abnormal weight gain: Secondary | ICD-10-CM | POA: Diagnosis not present

## 2024-09-06 DIAGNOSIS — L308 Other specified dermatitis: Secondary | ICD-10-CM

## 2024-09-06 DIAGNOSIS — J302 Other seasonal allergic rhinitis: Secondary | ICD-10-CM

## 2024-09-06 DIAGNOSIS — M26629 Arthralgia of temporomandibular joint, unspecified side: Secondary | ICD-10-CM

## 2024-09-06 NOTE — Patient Instructions (Signed)
 Orthostatic blood pressure readings were fine.  Overall blood pressure is elevated.  Try getting a blood pressure cuff to monitor your blood pressure at home.  They have fairly inexpensive ones ($20) at your local drugstore or on [employer].  Ideally your blood pressure should be less than 140/90.

## 2024-09-06 NOTE — Progress Notes (Signed)
 "  Established Patient Office Visit   Subjective  Patient ID: Pamela Valencia, female    DOB: 05-01-1996  Age: 28 y.o. MRN: [id number]  Chief Complaint  Patient presents with   Acute Visit    Patient came in for dizziness that's been going on for 2 months, 2 weeks ago the dizziness was the worse.    Pt is a 28 year old female seen for ongoing concern.  Patient endorses dizziness, intermittent x 2 months or more.  Worse in the last 2 weeks.  Had an episode while sitting at work.  Felt presyncopal.  Unable to recall specific details.  Does typically eat breakfast.  Has noticed dizziness with getting out of bed in the morning causing her to lay back down. Sleep is good. Has a history of bad L TMJ.  Also has braces on teeth which cause dental pain and make TMJ worse.  Few months ago went to UC as thought she had an ear infection.  Patient notes history of seasonal allergies.  Taking Zyrtec for years but it was not helpful.  Switch to Claritin 2 days ago.  Patient with dry, flaky skin of scalp and ears.  Skin and ears pruritic.  Seen by dermatology.  States clobetasol shampoo, sulfur 8, and triamcinolone  have been ineffective.   Also notes wt gain.  Meal prepping so eating the same things daily including chicken, salmon, rice.     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   Mastalgia 05/06/2020   Past Medical History:  Diagnosis Date   Acid reflux    Allergy    Asthma    Past Surgical History:  Procedure Laterality Date   CHOLECYSTECTOMY  7/15   Social History   Tobacco Use   Smoking status: Never    Passive exposure: Never   Smokeless tobacco: Never  Vaping Use   Vaping status: Never Used  Substance Use Topics   Alcohol use: Yes    Comment: 1 drink per 2 weeks   Drug use: No   Family History  Problem Relation Age of Onset   Diabetes Maternal  Grandfather    Cancer Maternal Grandmother    Miscarriages / Stillbirths Maternal Grandmother    Allergies  Allergen Reactions   Other Anaphylaxis    Peanuts   Latex Other (See Comments)    Rash/itching   Metronidazole  Itching    With burning sensation- vaginal gel.   With burning sensation- vaginal gel.   Peanut-Containing Drug Products Hives   Penicillins Diarrhea and Nausea And Vomiting    Has patient had a PCN reaction causing immediate rash, facial/tongue/throat swelling, SOB or lightheadedness with hypotension: No Has patient had a PCN reaction causing severe rash involving mucus membranes or skin necrosis: No Has patient had a PCN reaction that required hospitalization No Has patient had a PCN reaction occurring within the last 10 years: Yes If all of the above answers are NO, then may proceed with Cephalosporin use.     ROS Negative unless stated above    Objective:     BP (!) 154/90 (BP Location: Left Arm, Patient Position: Sitting)   Pulse 82   Temp 98.1 F (36.7 C) (Oral)   Ht 5' 5.35 (1.66 m)   Wt 187 lb 12.8 oz (85.2 kg)   SpO2 99%   BMI 30.92 kg/m  BP Readings from Last 3 Encounters:  09/06/24 (!) 154/90  06/14/24 130/82  11/02/23 122/74   Wt Readings from Last 3 Encounters:  09/06/24 187 lb 12.8 oz (85.2 kg)  06/14/24 181 lb 3.2 oz (82.2 kg)  11/02/23 173 lb 6.4 oz (78.7 kg)      Physical Exam Constitutional:      General: She is not in acute distress.    Appearance: Normal appearance.  HENT:     Head: Normocephalic and atraumatic.     Nose: Nose normal.     Mouth/Throat:     Mouth: Mucous membranes are moist.  Eyes:     Extraocular Movements:     Right eye: No nystagmus.     Left eye: No nystagmus.  Cardiovascular:     Rate and Rhythm: Normal rate and regular rhythm.     Heart sounds: Normal heart sounds. No murmur heard.    No gallop.  Pulmonary:     Effort: Pulmonary effort is normal. No respiratory distress.     Breath  sounds: Normal breath sounds. No wheezing, rhonchi or rales.  Skin:    General: Skin is warm and dry.  Neurological:     Mental Status: She is alert and oriented to person, place, and time.        09/06/2024    4:55 PM 06/14/2024    5:12 PM 11/02/2023    9:18 AM  Depression screen PHQ 2/9  Decreased Interest 0 0 0  Down, Depressed, Hopeless 0 0 0  PHQ - 2 Score 0 0 0  Altered sleeping 0 0 0  Tired, decreased energy 0 0 1  Change in appetite 0 0 0  Feeling bad or failure about yourself  0 0 0  Trouble concentrating 0 0 0  Moving slowly or fidgety/restless 0 0 0  Suicidal thoughts 0 0 0  PHQ-9 Score 0 0  1   Difficult doing work/chores Not difficult at all  Not difficult at all     Data saved with a previous flowsheet row definition      09/06/2024    4:55 PM 06/14/2024    5:12 PM 11/02/2023    9:18 AM  GAD 7 : Generalized Anxiety Score  Nervous, Anxious, on Edge 0 0 0  Control/stop worrying 0 0 0  Worry too much - different things 0 0 0  Trouble relaxing 0 0 0  Restless 0 0 0  Easily annoyed or irritable 0 0 0  Afraid - awful might happen 0 0 0  Total GAD 7 Score 0 0 0  Anxiety Difficulty  Not difficult at all Not difficult at all     No results found for any visits on 09/06/24.    Assessment & Plan:   Dizziness -     T4, free; Future -     TSH; Future  Pruritic dermatitis -     T4, free; Future -     TSH; Future  Seasonal allergies  Weight gain -     T4, free; Future -     TSH; Future  TMJ syndrome  Discussed various causes of dizziness including hypoglycemia, hypotension, bp elevation, dehydration, allergies, vertigo.  Orthostatic bp readings negative.  Bp elevated in clinic.  Pt to monitor bp at home.  For elevation consistently greater than 140/90 start medication.  Lifestyle modifications encouraged including increasing physical activity, water intake, and decreasing sodium intake.  Wt gain ,14 lbs in 1 yr may be contributing.  Given sample of Xyzal.   Supportive care  for TMJ.    Return in about 5 weeks (around 10/11/2024) for blood pressure.   Clotilda JONELLE Single, MD "

## 2024-09-09 ENCOUNTER — Other Ambulatory Visit

## 2024-09-09 DIAGNOSIS — R635 Abnormal weight gain: Secondary | ICD-10-CM

## 2024-09-09 DIAGNOSIS — L308 Other specified dermatitis: Secondary | ICD-10-CM

## 2024-09-09 DIAGNOSIS — R42 Dizziness and giddiness: Secondary | ICD-10-CM

## 2024-09-10 ENCOUNTER — Encounter: Payer: Self-pay | Admitting: Dermatology

## 2024-09-10 ENCOUNTER — Ambulatory Visit: Admitting: Dermatology

## 2024-09-10 VITALS — BP 134/91 | HR 83

## 2024-09-10 DIAGNOSIS — L910 Hypertrophic scar: Secondary | ICD-10-CM

## 2024-09-10 DIAGNOSIS — L219 Seborrheic dermatitis, unspecified: Secondary | ICD-10-CM

## 2024-09-10 LAB — T4, FREE: Free T4: 0.72 ng/dL (ref 0.60–1.60)

## 2024-09-10 LAB — TSH: TSH: 2.18 u[IU]/mL (ref 0.35–5.50)

## 2024-09-10 MED ORDER — TRIAMCINOLONE ACETONIDE 40 MG/ML IJ SUSP
40.0000 mg | Freq: Once | INTRAMUSCULAR | Status: AC
  Administered 2024-09-10: 40 mg via INTRAMUSCULAR

## 2024-09-10 MED ORDER — ZORYVE 0.3 % EX FOAM: 1.0000 | Freq: Every day | CUTANEOUS | 9 refills | Status: AC

## 2024-09-10 NOTE — Patient Instructions (Addendum)

## 2024-09-10 NOTE — Progress Notes (Signed)
 Follow-Up Visit   Subjective  Pamela Valencia is a 28 y.o. female who presents for the following: Keloid and dark spot  Patient present today for follow up visit for Keloid. Patient was last evaluated on 12/28/2023. At this visit patient had 3rd set of ILK Injections (Kenalog  40mg ). Patient reports sxs are worse. She feels the one on her upper abdomen has gotten bigger and now are painful she rates her pain 7 out of 10. Patient denies medication changes.  NEW CONCERN: Patient states she has dark spot located at the forehead that she would like to have examined. Patient reports the areas have been there for several weeks. She reports the areas can bothersome.Patient rates irritation 5 out of 10. Patient reports she has not previously been treated for these areas. Patient denies Hx of bx. Patient denies family history of skin cancer(s).  Patient reports she is not actively pregnant, trying to conceive or nursing.  Patient provided verbal consent for the use of an AI-assisted program to generate a detailed after-visit summary. The patient understands that the AI tool is used to support clinical documentation and that all information will be reviewed and verified by the healthcare provider.  The following portions of the chart were reviewed this encounter and updated as appropriate: medications, allergies, medical history  Review of Systems:  No other skin or systemic complaints except as noted in HPI or Assessment and Plan.  Objective  Well appearing patient in no apparent distress; mood and affect are within normal limits.  A focused examination was performed of the following areas: Abdomen and Left Mid to upper Back  Relevant exam findings are noted in the Assessment and Plan.             Left Abdomen (side) - Lower, Right Abdomen (side) - Lower Firm pink/brown dermal papule(s)  Assessment & Plan   Keloids Persistent keloids on the upper abdomen, left upper back, and left and  right flanks. The upper abdominal keloid has increased in size and is painful, rated 7/10. The left upper back keloid is less bothersome unless accidentally scratched. The umbilical keloid has improved post-injection. The flank keloids remain persistent. - Administered 0.3 cc injections to the upper abdominal and flank keloids. - Scheduled follow-up in 8 weeks to monitor the larger keloid.  Seborrheic dermatitis New pigmented spot on the forehead, likely related to seborrheic dermatitis, which has flared recently. The spot is sore to touch. Seborrheic dermatitis is exacerbated by seasonal changes and scalp flakiness. - Prescribed Zoryve  foam for topical application once daily to control inflammation and improve skin barrier. - Recommended DHS Zinc 2% shampoo for use on shampoo days, allowing it to sit for 2-3 minutes before rinsing. - Advised against oiling the scalp to prevent exacerbation. - Instructed to use sunscreen to prevent darkening of the spot due to UV exposure. - Sent prescription to [facility] pharmacy for insurance coverage attempt. - If Zoryve  foam is not covered, will switch to an alternative liquid solution available at a regular pharmacy. SEBORRHEIC DERMATITIS   This Visit - Roflumilast  (ZORYVE ) 0.3 % FOAM - Apply 1 Application topically daily. KELOID (2) Left Abdomen (side) - Lower, Right Abdomen (side) - Lower Procedure Note Intralesional Injection  Location: Abdomen and Left Mid to upper Back  Informed Consent: Discussed risks (infection, pain, bleeding, bruising, thinning of the skin, loss of skin pigment, lack of resolution, and recurrence of lesion) and benefits of the procedure, as well as the alternatives. Informed consent was  obtained. Preparation: The area was prepared a standard fashion.  Anesthesia:None  Procedure Details: An intralesional injection was performed with Kenalog  40 mg/cc. 0.3 cc in total were injected. NDC #: [address]-8 Exp: 01/2025 LOT:  [id number]  Total number of injections: 10  Plan: The patient was instructed on post-op care. Recommend OTC analgesia as needed for pain.  This Visit - triamcinolone  acetonide (KENALOG -40) injection 40 mg  Return in about 8 weeks (around 11/05/2024) for Keloid F/U (Ok'd to DB at 0:15 slot per JD).  I, Jetta Ager, am acting as neurosurgeon for [employer], DO.   Documentation: I have reviewed the above documentation for accuracy and completeness, and I agree with the above.  Delon Lenis, DO

## 2024-09-12 ENCOUNTER — Ambulatory Visit: Payer: Self-pay | Admitting: Family Medicine

## 2024-09-24 ENCOUNTER — Encounter (HOSPITAL_BASED_OUTPATIENT_CLINIC_OR_DEPARTMENT_OTHER): Admitting: Certified Nurse Midwife

## 2024-10-09 ENCOUNTER — Telehealth: Admitting: Physician Assistant

## 2024-10-09 DIAGNOSIS — B9689 Other specified bacterial agents as the cause of diseases classified elsewhere: Secondary | ICD-10-CM

## 2024-10-09 DIAGNOSIS — N760 Acute vaginitis: Secondary | ICD-10-CM | POA: Diagnosis not present

## 2024-10-09 DIAGNOSIS — B379 Candidiasis, unspecified: Secondary | ICD-10-CM | POA: Diagnosis not present

## 2024-10-09 DIAGNOSIS — T3695XA Adverse effect of unspecified systemic antibiotic, initial encounter: Secondary | ICD-10-CM

## 2024-10-09 MED ORDER — FLUCONAZOLE 150 MG PO TABS: 150.0000 mg | ORAL_TABLET | ORAL | 0 refills | Status: AC | PRN

## 2024-10-09 MED ORDER — METRONIDAZOLE 500 MG PO TABS: 500.0000 mg | ORAL_TABLET | Freq: Two times a day (BID) | ORAL | 0 refills | Status: AC

## 2024-10-09 NOTE — Progress Notes (Signed)
 We are sorry that you are not feeling well. Here is how we plan to help! Based on what you shared with me it looks like you: May have a vaginosis due to bacteria  Vaginosis is an inflammation of the vagina that can result in discharge, itching and pain. The cause is usually a change in the normal balance of vaginal bacteria or an infection. Vaginosis can also result from reduced estrogen levels after menopause.  The most common causes of vaginosis are:   Bacterial vaginosis which results from an overgrowth of one on several organisms that are normally present in your vagina.   Yeast infections which are caused by a naturally occurring fungus called candida.   Vaginal atrophy (atrophic vaginosis) which results from the thinning of the vagina from reduced estrogen levels after menopause.   Trichomoniasis which is caused by a parasite and is commonly transmitted by sexual intercourse.  Factors that increase your risk of developing vaginosis include: Medications, such as antibiotics and steroids Uncontrolled diabetes Use of hygiene products such as bubble bath, vaginal spray or vaginal deodorant Douching Wearing damp or tight-fitting clothing Using an intrauterine device (IUD) for birth control Hormonal changes, such as those associated with pregnancy, birth control pills or menopause Sexual activity Having a sexually transmitted infection  Your treatment plan is Metronidazole  or Flagyl  500mg  twice a day for 7 days.  I have electronically sent this prescription into the pharmacy that you have chosen.  Diflucan  given as prophylaxis as patient tends to get vaginal yeast infections with antibiotic use.  Be sure to take all of the medication as directed. Stop taking any medication if you develop a rash, tongue swelling or shortness of breath. Mothers who are breast feeding should consider pumping and discarding their breast milk while on these antibiotics. However, there is no consensus that  infant exposure at these doses would be harmful.  Remember that medication creams can weaken latex condoms.   HOME CARE:  Good hygiene may prevent some types of vaginosis from recurring and may relieve some symptoms:  Avoid baths, hot tubs and whirlpool spas. Rinse soap from your outer genital area after a shower, and dry the area well to prevent irritation. Don't use scented or harsh soaps, such as those with deodorant or antibacterial action. Avoid irritants. These include scented tampons and pads. Wipe from front to back after using the toilet. Doing so avoids spreading fecal bacteria to your vagina.  Other things that may help prevent vaginosis include:  Don't douche. Your vagina doesn't require cleansing other than normal bathing. Repetitive douching disrupts the normal organisms that reside in the vagina and can actually increase your risk of vaginal infection. Douching won't clear up a vaginal infection. Use a latex condom. Both female and female latex condoms may help you avoid infections spread by sexual contact. Wear cotton underwear. Also wear pantyhose with a cotton crotch. If you feel comfortable without it, skip wearing underwear to bed. Yeast thrives in [employer] Your symptoms should improve in the next day or two.  GET HELP RIGHT AWAY IF:  You have pain in your lower abdomen ( pelvic area or over your ovaries) You develop nausea or vomiting You develop a fever Your discharge changes or worsens You have persistent pain with intercourse You develop shortness of breath, a rapid pulse, or you faint.  These symptoms could be signs of problems or infections that need to be evaluated by a medical provider now.  MAKE SURE YOU   Understand  these instructions. Will watch your condition. Will get help right away if you are not doing well or get worse.  Your e-visit answers were reviewed by a board certified advanced clinical practitioner to complete your personal care  plan. Depending upon the condition, your plan could have included both over the counter or prescription medications. Please review your pharmacy choice to make sure that you have choses a pharmacy that is open for you to pick up any needed prescription, Your safety is important to us . If you have drug allergies check your prescription carefully.   You can use MyChart to ask questions about today's visit, request a non-urgent call back, or ask for a work or school excuse for 24 hours related to this e-Visit. If it has been greater than 24 hours you will need to follow up with your provider, or enter a new e-Visit to address those concerns. You will get a MyChart message within the next two days asking about your experience. I hope that your e-visit has been valuable and will speed your recovery.   I have spent 5 minutes in review of e-visit questionnaire, review and updating patient chart, medical decision making and response to patient.   Delon CHRISTELLA Dickinson, PA-C

## 2024-10-10 ENCOUNTER — Telehealth

## 2024-11-07 ENCOUNTER — Ambulatory Visit: Admitting: Dermatology

## 2024-11-07 ENCOUNTER — Encounter: Payer: Self-pay | Admitting: Dermatology

## 2024-11-07 ENCOUNTER — Encounter (HOSPITAL_BASED_OUTPATIENT_CLINIC_OR_DEPARTMENT_OTHER): Admitting: Certified Nurse Midwife

## 2024-11-07 DIAGNOSIS — L209 Atopic dermatitis, unspecified: Secondary | ICD-10-CM

## 2024-11-07 DIAGNOSIS — L219 Seborrheic dermatitis, unspecified: Secondary | ICD-10-CM

## 2024-11-07 DIAGNOSIS — L658 Other specified nonscarring hair loss: Secondary | ICD-10-CM

## 2024-11-07 MED ORDER — DESONIDE 0.05 % EX CREA: TOPICAL_CREAM | Freq: Two times a day (BID) | CUTANEOUS | 3 refills | Status: AC | PRN

## 2024-11-07 MED ORDER — FLUOCINOLONE ACETONIDE BODY 0.01 % EX OIL: 1.0000 | TOPICAL_OIL | Freq: Two times a day (BID) | CUTANEOUS | 4 refills | Status: AC

## 2024-11-07 NOTE — Patient Instructions (Addendum)
 VISIT SUMMARY:  During your visit, we discussed the itchy bumps on your face and neck, which have been bothering you since last Friday. We also addressed your scalp condition and hair care practices.  YOUR PLAN:  -ATOPIC DERMATITIS INVOLVING FACE AND NECK:  Atopic dermatitis is a condition that makes your skin red and itchy. For your face and neck, we prescribed desonide  cream for 10 days and provided gentle cleansers for facial use. Avoid using Dial soap on your face. We also provided samples of Cicloplast and Cicloplate for facial moisturizing and recommended CeraVe ointments for your neck and chest, but not for your face.  -SEBORRHEIC DERMATITIS OF SCALP:  Seborrheic dermatitis is a common skin condition that causes flaky, itchy patches on your scalp.  We recommended using DHS shampoo for scalp treatment and prescribed fluocinonide oil as a pre-wash treatment.   Please use the Zoryve  foam treatment consistently until symptoms resolve, then twice weekly for prevention. We also discussed using oil treatments before wash day to hydrate your scalp without over-accumulation.  -TRACTION ALOPECIA:  Traction alopecia is hair loss caused by continuous tension and heat application to the hair. We advised you to reduce tension and heat application to your hairline and recommended alternating between blowouts and wash-and-go styles to prevent further hair loss.  INSTRUCTIONS:  Please follow the prescribed treatments and recommendations for your skin and scalp conditions. If your symptoms do not improve or worsen, schedule a follow-up appointment. Continue to monitor your hair care practices to prevent traction alopecia.        Important Information  Due to recent changes in healthcare laws, you may see results of your pathology and/or laboratory studies on MyChart before the doctors have had a chance to review them. We understand that in some cases there may be results that are confusing or  concerning to you. Please understand that not all results are received at the same time and often the doctors may need to interpret multiple results in order to provide you with the best plan of care or course of treatment. Therefore, we ask that you please give us  2 business days to thoroughly review all your results before contacting the office for clarification. Should we see a critical lab result, you will be contacted sooner.   If You Need Anything After Your Visit  If you have any questions or concerns for your doctor, please call our main line at [phone] If no one answers, please leave a voicemail as directed and we will return your call as soon as possible. Messages left after 4 pm will be answered the following business day.   You may also send us  a message via MyChart. We typically respond to MyChart messages within 1-2 business days.  For prescription refills, please ask your pharmacy to contact our office. Our fax number is [phone].  If you have an urgent issue when the clinic is closed that cannot wait until the next business day, you can page your doctor at the number below.    Please note that while we do our best to be available for urgent issues outside of office hours, we are not available 24/7.   If you have an urgent issue and are unable to reach us , you may choose to seek medical care at your doctor's office, retail clinic, urgent care center, or emergency room.  If you have a medical emergency, please immediately call 911 or go to the emergency department. In the event of inclement weather, please call  our main line at [phone] for an update on the status of any delays or closures.  Dermatology Medication Tips: Please keep the boxes that topical medications come in in order to help keep track of the instructions about where and how to use these. Pharmacies typically print the medication instructions only on the boxes and not directly on the medication tubes.    If your medication is too expensive, please contact our office at [phone] or send us  a message through MyChart.   We are unable to tell what your co-pay for medications will be in advance as this is different depending on your insurance coverage. However, we may be able to find a substitute medication at lower cost or fill out paperwork to get insurance to cover a needed medication.   If a prior authorization is required to get your medication covered by your insurance company, please allow us  1-2 business days to complete this process.  Drug prices often vary depending on where the prescription is filled and some pharmacies may offer cheaper prices.  The website www.goodrx.com contains coupons for medications through different pharmacies. The prices here do not account for what the cost may be with help from insurance (it may be cheaper with your insurance), but the website can give you the price if you did not use any insurance.  - You can print the associated coupon and take it with your prescription to the pharmacy.  - You may also stop by our office during regular business hours and pick up a GoodRx coupon card.  - If you need your prescription sent electronically to a different pharmacy, notify our office through [facility] or by phone at [phone]

## 2024-11-07 NOTE — Progress Notes (Signed)
 "  Follow-Up Visit   Patient (and/or pt guardian) consented to the use of AI-assisted tools for note generation.  Subjective  Pamela Valencia is a 29 y.o. female who presents for the following: keloid  Patient was last evaluated on 09/10/24.  At this visit patient was given kenalog  40 injections - 0.3 cc total injected with 10 total injections Patient reports that areas on abdomen are almost completely flat and areas by the bra line have gone down some and no longer itch Patient denies medication changes. -Patient states that she does not want to focus on her keloids for today's appointment   Sebderm: - prescribed Zoryve  and recommended DHS zinc shampoo Patient reports that the Zoryve  is hard for her to use because it leaves white patches and makes her hair curly. She states that she has not been consistent with use and does not use it daily  Patient reports she has not been using DHS zinc shampoo but was recommended by a cosmetologist to use a different shampoo and has been using it instead. Patient states she believes the shampoo helps and gets her hair done every two weeks  Patient would also like to discuss bumps on face that came up last Friday that itch and burn. Patient states that she tried benadryl  to help with itching. Patient reports she has not made any changes to her normal skin care routine and has not had this happen before. Patient reports that she is using dial soap on her face  The following portions of the chart were reviewed this encounter and updated as appropriate: medications, allergies, medical history  Review of Systems:  No other skin or systemic complaints except as noted in HPI or Assessment and Plan.  Objective  Well appearing patient in no apparent distress; mood and affect are within normal limits.  A focused examination was performed of the following areas: Scalp and face  Relevant exam findings are noted in the Assessment and Plan.    Assessment  & Plan   Atopic dermatitis involving face and neck Acute flare of atopic dermatitis on the face and neck, characterized by itching and discoloration. Symptoms began on Friday and have persisted despite triamcinolone  cream use. Itching severity is 8/10. Possible exacerbation due to use of Dial soap, which may strip natural oils from the skin. Differential includes allergic reaction, but no new products identified. Triamcinolone  cream caused burning on the neck, indicating sensitivity. - Prescribed desonide  cream for 10 days. - Provided gentle cleansers for facial use. - Advised against using Dial soap on the face. - Provided samples of Cicloplast and Cicloplate for facial moisturizing. - Recommended ceralyte ointments for neck and chest, but not for the face.  Seborrheic dermatitis of scalp Chronic seborrheic dermatitis of the scalp with flaking and itching, exacerbated in winter. Previous use of charcoal-based shampoo was effective. Inconsistent use of foam treatment due to hair styling. Discussed the importance of avoiding oil accumulation and the benefits of DHS shampoo for controlling yeast overgrowth.  - Recommended DHS shampoo for scalp treatment. - Prescribed fluocinonide oil for scalp treatment, to be used as a pre-wash treatment. - Advised on consistent use of foam treatment until symptoms resolve, then twice weekly for prevention. - Discussed the use of oil treatments before wash day to hydrate the scalp without over-accumulation.  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 Advised on the risks of continuous tension and heat application, which can lead to permanent hair loss.  -  Advised on reducing tension and heat application to the hairline. - Recommended alternating between blowouts and wash-and-go styles to prevent traction alopecia.   No follow-ups on file.  LILLETTE Lyle Cords, am acting as a neurosurgeon for [employer], DO  .   Documentation: I have reviewed the above documentation for accuracy and completeness, and I agree with the above.  Delon Lenis, DO   "

## 2024-11-28 ENCOUNTER — Encounter (HOSPITAL_BASED_OUTPATIENT_CLINIC_OR_DEPARTMENT_OTHER): Admitting: Certified Nurse Midwife

## 2025-02-05 ENCOUNTER — Ambulatory Visit: Admitting: Dermatology
